# Patient Record
Sex: Female | Born: 1980 | Race: White | Hispanic: No | Marital: Married | Smoking: Former smoker
Health system: Southern US, Community
[De-identification: ages and names within clinical notes are randomized; demographics above are authoritative.]

## PROBLEM LIST (undated history)

## (undated) DIAGNOSIS — Z8759 Personal history of other complications of pregnancy, childbirth and the puerperium: Secondary | ICD-10-CM

## (undated) DIAGNOSIS — H209 Unspecified iridocyclitis: Secondary | ICD-10-CM

## (undated) DIAGNOSIS — R195 Other fecal abnormalities: Secondary | ICD-10-CM

## (undated) DIAGNOSIS — K5090 Crohn's disease, unspecified, without complications: Secondary | ICD-10-CM

## (undated) DIAGNOSIS — Z8709 Personal history of other diseases of the respiratory system: Secondary | ICD-10-CM

## (undated) DIAGNOSIS — C4491 Basal cell carcinoma of skin, unspecified: Secondary | ICD-10-CM

## (undated) HISTORY — DX: Personal history of other complications of pregnancy, childbirth and the puerperium: Z87.59

## (undated) HISTORY — DX: Basal cell carcinoma of skin, unspecified: C44.91

## (undated) HISTORY — DX: Crohn's disease, unspecified, without complications: K50.90

## (undated) HISTORY — DX: Other fecal abnormalities: R19.5

## (undated) HISTORY — DX: Unspecified iridocyclitis: H20.9

## (undated) HISTORY — DX: Personal history of other diseases of the respiratory system: Z87.09

---

## 2003-01-21 HISTORY — PX: TONSILLECTOMY: SUR1361

## 2015-09-20 LAB — HM HIV SCREENING LAB: HM HIV Screening: NEGATIVE

## 2017-11-19 DIAGNOSIS — M79642 Pain in left hand: Secondary | ICD-10-CM | POA: Insufficient documentation

## 2017-11-19 DIAGNOSIS — M79641 Pain in right hand: Secondary | ICD-10-CM | POA: Insufficient documentation

## 2017-11-19 DIAGNOSIS — Z8739 Personal history of other diseases of the musculoskeletal system and connective tissue: Secondary | ICD-10-CM | POA: Insufficient documentation

## 2017-11-19 DIAGNOSIS — Z13820 Encounter for screening for osteoporosis: Secondary | ICD-10-CM | POA: Insufficient documentation

## 2017-12-09 ENCOUNTER — Ambulatory Visit: Payer: Self-pay | Admitting: Family Medicine

## 2017-12-23 ENCOUNTER — Encounter: Payer: Self-pay | Admitting: Family Medicine

## 2017-12-23 ENCOUNTER — Ambulatory Visit (INDEPENDENT_AMBULATORY_CARE_PROVIDER_SITE_OTHER): Payer: BLUE CROSS/BLUE SHIELD | Admitting: Family Medicine

## 2017-12-23 VITALS — BP 108/72 | HR 60 | Ht 69.0 in | Wt 181.0 lb

## 2017-12-23 DIAGNOSIS — Z01419 Encounter for gynecological examination (general) (routine) without abnormal findings: Secondary | ICD-10-CM | POA: Diagnosis not present

## 2017-12-23 DIAGNOSIS — Z3202 Encounter for pregnancy test, result negative: Secondary | ICD-10-CM

## 2017-12-23 DIAGNOSIS — Z1151 Encounter for screening for human papillomavirus (HPV): Secondary | ICD-10-CM

## 2017-12-23 DIAGNOSIS — Z23 Encounter for immunization: Secondary | ICD-10-CM | POA: Diagnosis not present

## 2017-12-23 DIAGNOSIS — N912 Amenorrhea, unspecified: Secondary | ICD-10-CM

## 2017-12-23 DIAGNOSIS — R300 Dysuria: Secondary | ICD-10-CM

## 2017-12-23 DIAGNOSIS — Z30011 Encounter for initial prescription of contraceptive pills: Secondary | ICD-10-CM

## 2017-12-23 DIAGNOSIS — Z124 Encounter for screening for malignant neoplasm of cervix: Secondary | ICD-10-CM

## 2017-12-23 LAB — POCT URINALYSIS DIPSTICK
Blood, UA: NEGATIVE
Leukocytes, UA: NEGATIVE

## 2017-12-23 LAB — POCT URINE PREGNANCY: Preg Test, Ur: NEGATIVE

## 2017-12-23 MED ORDER — NORGESTIM-ETH ESTRAD TRIPHASIC 0.18/0.215/0.25 MG-25 MCG PO TABS: 1.0000 | ORAL_TABLET | Freq: Every day | ORAL | 11 refills | Status: DC

## 2017-12-23 NOTE — Progress Notes (Signed)
GYNECOLOGY ANNUAL PREVENTATIVE CARE ENCOUNTER NOTE  Subjective:   Laura Fuentes is a 37 y.o. G53P0102 female here for a routine annual gynecologic exam.  Current complaints: none, currently 7.5 mon postpartum from twin delivery complicated by severe preeclampsia with induction at 35wks, fetal intolerance and primary CS.   Denies abnormal vaginal bleeding, discharge, pelvic pain, problems with intercourse or other gynecologic concerns.    Gynecologic History No LMP recorded. Contraception: none Last Pap: 2017- wnl per report Last mammogram: na  The following portions of the patient's history were reviewed and updated as appropriate: allergies, current medications, past family history, past medical history, past social history, past surgical history and problem list.  Review of Systems Pertinent items are noted in HPI.   Objective:  BP 108/72   Pulse 60   Ht 5' 9"  (1.753 m)   Wt 181 lb (82.1 kg)   BMI 26.73 kg/m  CONSTITUTIONAL: Well-developed, well-nourished female in no acute distress.  HENT:  Normocephalic, atraumatic, External right and left ear normal. Oropharynx is clear and moist EYES:  No scleral icterus.  NECK: Normal range of motion, supple, no masses.  Normal thyroid.  SKIN: Skin is warm and dry. No rash noted. Not diaphoretic. No erythema. No pallor. NEUROLOGIC: Alert and oriented to person, place, and time. Normal reflexes, muscle tone coordination. No cranial nerve deficit noted. PSYCHIATRIC: Normal mood and affect. Normal behavior. Normal judgment and thought content. CARDIOVASCULAR: Normal heart rate noted, regular rhythm. 2+ distal pulses. RESPIRATORY: Effort and breath sounds normal, no problems with respiration noted. BREASTS: Symmetric in size. No masses, skin changes, nipple drainage, or lymphadenopathy. ABDOMEN: Soft,  no distention noted.  No tenderness, rebound or guarding.  PELVIC: Normal appearing external genitalia; normal appearing vaginal mucosa and  cervix.  No abnormal discharge noted.  Pap smear obtained.  Normal uterine size, no other palpable masses, no uterine or adnexal tenderness. MUSCULOSKELETAL: Normal range of motion.   Assessment and Plan:  1. Dysuria Reports incomplete emptying.  - POCT Urinalysis Dipstick - Consider pelvic floor PT  2. Encounter for oral contraception initial prescription Contraception counseling: Reviewed all forms of birth control options available including abstinence; over the counter/barrier methods; hormonal contraceptive medication including pill, patch, ring, injection,contraceptive implant; hormonal and nonhormonal IUDs; permanent sterilization options including vasectomy and the various tubal sterilization modalities. Risks and benefits reviewed.  Questions were answered.  Written information was also given to the patient to review.  Patient desires OCP, this was prescribed for patient. We discussed possible effects on milk supply. She will follow up in  61yrfor surveillance.  She was told to call with any further questions, or with any concerns about this method of contraception.  Emphasized use of condoms 100% of the time for STI prevention. - POCT urine pregnancy - Norgestimate-Ethinyl Estradiol Triphasic 0.18/0.215/0.25 MG-25 MCG tab; Take 1 tablet by mouth daily.  Dispense: 1 Package; Refill: 11  3. Well woman exam with routine gynecological exam Annual gynecologic examination with pap smear:  Will follow up results of pap smear and manage accordingly. Declined STI screen.  Routine preventative health maintenance measures emphasized. - POCT urine pregnancy - Cytology - PAP( [place]) - If BV present will treat per standing given + odor, +dicharge and +irritation.  - Norgestimate-Ethinyl Estradiol Triphasic 0.18/0.215/0.25 MG-25 MCG tab; Take 1 tablet by mouth daily.  Dispense: 1 Package; Refill: 11  4. Lactational amenorrhea- currently nursing infants 3-4 times daily and more over the  nighttime.   Please refer to  After Visit Summary for other counseling recommendations.   Return in about 1 year (around 12/24/2018) for Yearly wellness exam.  Caren Macadam, MD, MPH, ABFM Attending [place] for [facility]

## 2017-12-23 NOTE — Progress Notes (Signed)
Last Pap June 2017-normal   Think she may have BV or a yeast infection, having some pain with intercourse, and occasional urgency with urination.

## 2017-12-24 LAB — CYTOLOGY - PAP
Adequacy: ABSENT
Bacterial vaginitis: NEGATIVE
Candida vaginitis: NEGATIVE
Diagnosis: NEGATIVE
HPV: NOT DETECTED

## 2017-12-30 ENCOUNTER — Encounter: Payer: Self-pay | Admitting: Radiology

## 2018-01-01 DIAGNOSIS — L718 Other rosacea: Secondary | ICD-10-CM | POA: Diagnosis not present

## 2018-01-01 DIAGNOSIS — L301 Dyshidrosis [pompholyx]: Secondary | ICD-10-CM | POA: Diagnosis not present

## 2018-01-01 DIAGNOSIS — D485 Neoplasm of uncertain behavior of skin: Secondary | ICD-10-CM | POA: Diagnosis not present

## 2018-01-01 DIAGNOSIS — D2261 Melanocytic nevi of right upper limb, including shoulder: Secondary | ICD-10-CM | POA: Diagnosis not present

## 2018-03-02 ENCOUNTER — Encounter: Payer: Self-pay | Admitting: Family Medicine

## 2018-03-02 ENCOUNTER — Ambulatory Visit (INDEPENDENT_AMBULATORY_CARE_PROVIDER_SITE_OTHER): Payer: BLUE CROSS/BLUE SHIELD | Admitting: Family Medicine

## 2018-03-02 VITALS — BP 98/68 | HR 60 | Temp 98.4°F | Ht 69.0 in | Wt 181.0 lb

## 2018-03-02 DIAGNOSIS — F419 Anxiety disorder, unspecified: Secondary | ICD-10-CM | POA: Diagnosis not present

## 2018-03-02 DIAGNOSIS — F430 Acute stress reaction: Secondary | ICD-10-CM | POA: Diagnosis not present

## 2018-03-02 MED ORDER — HYDROXYZINE PAMOATE 50 MG PO CAPS: 50.0000 mg | ORAL_CAPSULE | Freq: Three times a day (TID) | ORAL | 1 refills | Status: DC | PRN

## 2018-03-02 NOTE — Assessment & Plan Note (Signed)
Given symptoms x 3 weeks this diagnosis is most consistent. Cont therapy. Discussed that with stopping breast feeding and starting OCPs it may just take time for her body to adjust. However, could consider SSRI if not improvement. Elected to wait and will trial hydroxyzine prn and [facility] Wart.

## 2018-03-02 NOTE — Patient Instructions (Signed)
#  Acute stress reaction - consider trying regular [facility] Wart or Lavendar Oil  -- Use Hydroxyzine as needed if feeling stressed or highly irritable. It may make you sleepy  Return in a few weeks if no improvement and you want to start medication

## 2018-03-02 NOTE — Progress Notes (Signed)
Subjective:     Laura Fuentes is a 38 y.o. female presenting for Establish Care (previous PCP in [place]) and Depression (Weaned off her twins off breastfeeding about 3 weeks ago and also started birth control right around that time and is struggling with depression symptoms now.)     HPI  #Depression - started birth control and weaned breast feeding 3 weeks ago - symptoms since - noticed some emotional eating or snacking, but this seems to have gone away - has had periods of life where she has felt off, but typically managed with diet/exercise changes - has never been on an antidepressant, but considering it - more irritable than before - went through a week of feeling this way when they moved to [place] 6 months ago, but that has improved - has been in therapy for about 1 month - was seeing a therapist   #urinary urgency - has been going on since she had the baby - asked the ob/gyn and told it was normal - vaginal dryness - normal work-up with OB - felt this was normal and would improve with OCPs  Review of Systems See HPI  Social History   Tobacco Use  Smoking Status Former Smoker  . Packs/day: 0.25  . Years: 5.00  . Pack years: 1.25  . Types: Cigarettes  . Last attempt to quit: 01/20/2013  . Years since quitting: 5.1  Smokeless Tobacco Never Used  Tobacco Comment   social smoker- 4 to 5 cigarretes at a time        Objective:    BP Readings from Last 3 Encounters:  03/02/18 98/68  12/23/17 108/72   Wt Readings from Last 3 Encounters:  03/02/18 181 lb (82.1 kg)  12/23/17 181 lb (82.1 kg)    BP 98/68   Pulse 60   Temp 98.4 F (36.9 C)   Ht 5' 9"  (1.753 m)   Wt 181 lb (82.1 kg)   LMP 03/02/2018   SpO2 98%   BMI 26.73 kg/m    Physical Exam Constitutional:      General: She is not in acute distress.    Appearance: She is well-developed. She is not diaphoretic.  HENT:     Right Ear: External ear normal.     Left Ear: External ear normal.     Nose:  Nose normal.  Eyes:     Conjunctiva/sclera: Conjunctivae normal.  Neck:     Musculoskeletal: Neck supple.  Cardiovascular:     Rate and Rhythm: Normal rate.  Pulmonary:     Effort: Pulmonary effort is normal.  Skin:    General: Skin is warm and dry.     Capillary Refill: Capillary refill takes less than 2 seconds.  Neurological:     Mental Status: She is alert. Mental status is at baseline.  Psychiatric:        Mood and Affect: Mood normal.        Behavior: Behavior normal.    Depression screen [facility] 2/9 03/02/2018  Decreased Interest 0  Down, Depressed, Hopeless 2  PHQ - 2 Score 2  Altered sleeping 0  Tired, decreased energy 1  Change in appetite 1  Feeling bad or failure about yourself  2  Trouble concentrating 2  Moving slowly or fidgety/restless 0  Suicidal thoughts 1  PHQ-9 Score 9           Assessment & Plan:   Problem List Items Addressed This Visit      Other   Acute  stress reaction    Given symptoms x 3 weeks this diagnosis is most consistent. Cont therapy. Discussed that with stopping breast feeding and starting OCPs it may just take time for her body to adjust. However, could consider SSRI if not improvement. Elected to wait and will trial hydroxyzine prn and [facility] Wart.       Relevant Medications   hydrOXYzine (VISTARIL) 50 MG capsule    Other Visit Diagnoses    Anxiety    -  Primary   Relevant Medications   hydrOXYzine (VISTARIL) 50 MG capsule       Return in about 4 weeks (around 03/30/2018).  Lesleigh Noe, MD

## 2018-03-18 ENCOUNTER — Ambulatory Visit: Payer: BLUE CROSS/BLUE SHIELD | Admitting: Family Medicine

## 2018-03-29 ENCOUNTER — Encounter: Payer: Self-pay | Admitting: Family Medicine

## 2018-03-29 DIAGNOSIS — J302 Other seasonal allergic rhinitis: Secondary | ICD-10-CM | POA: Insufficient documentation

## 2018-03-31 DIAGNOSIS — F4323 Adjustment disorder with mixed anxiety and depressed mood: Secondary | ICD-10-CM | POA: Diagnosis not present

## 2018-04-14 DIAGNOSIS — F4323 Adjustment disorder with mixed anxiety and depressed mood: Secondary | ICD-10-CM | POA: Diagnosis not present

## 2018-04-23 DIAGNOSIS — F4323 Adjustment disorder with mixed anxiety and depressed mood: Secondary | ICD-10-CM | POA: Diagnosis not present

## 2018-04-28 DIAGNOSIS — F4323 Adjustment disorder with mixed anxiety and depressed mood: Secondary | ICD-10-CM | POA: Diagnosis not present

## 2018-05-05 DIAGNOSIS — F4323 Adjustment disorder with mixed anxiety and depressed mood: Secondary | ICD-10-CM | POA: Diagnosis not present

## 2018-05-10 ENCOUNTER — Other Ambulatory Visit: Payer: Self-pay | Admitting: *Deleted

## 2018-05-10 MED ORDER — PHENAZOPYRIDINE HCL 200 MG PO TABS: 200.0000 mg | ORAL_TABLET | Freq: Three times a day (TID) | ORAL | 1 refills | Status: DC | PRN

## 2018-05-10 MED ORDER — NITROFURANTOIN MONOHYD MACRO 100 MG PO CAPS: 100.0000 mg | ORAL_CAPSULE | Freq: Two times a day (BID) | ORAL | 1 refills | Status: DC

## 2018-05-10 NOTE — Telephone Encounter (Signed)
Pt called requesting meds for UTI, sent in med to [facility] per protocol, left message for patient that meds where sent in and if UTI is not better then to call office back to come in.

## 2018-05-12 DIAGNOSIS — F4323 Adjustment disorder with mixed anxiety and depressed mood: Secondary | ICD-10-CM | POA: Diagnosis not present

## 2018-05-19 DIAGNOSIS — F4323 Adjustment disorder with mixed anxiety and depressed mood: Secondary | ICD-10-CM | POA: Diagnosis not present

## 2018-06-02 DIAGNOSIS — F4323 Adjustment disorder with mixed anxiety and depressed mood: Secondary | ICD-10-CM | POA: Diagnosis not present

## 2018-06-09 DIAGNOSIS — F4323 Adjustment disorder with mixed anxiety and depressed mood: Secondary | ICD-10-CM | POA: Diagnosis not present

## 2018-06-16 DIAGNOSIS — F4323 Adjustment disorder with mixed anxiety and depressed mood: Secondary | ICD-10-CM | POA: Diagnosis not present

## 2018-06-23 DIAGNOSIS — F4323 Adjustment disorder with mixed anxiety and depressed mood: Secondary | ICD-10-CM | POA: Diagnosis not present

## 2018-06-30 DIAGNOSIS — F4323 Adjustment disorder with mixed anxiety and depressed mood: Secondary | ICD-10-CM | POA: Diagnosis not present

## 2018-07-12 DIAGNOSIS — F4323 Adjustment disorder with mixed anxiety and depressed mood: Secondary | ICD-10-CM | POA: Diagnosis not present

## 2018-07-14 DIAGNOSIS — F4323 Adjustment disorder with mixed anxiety and depressed mood: Secondary | ICD-10-CM | POA: Diagnosis not present

## 2018-07-26 DIAGNOSIS — F4323 Adjustment disorder with mixed anxiety and depressed mood: Secondary | ICD-10-CM | POA: Diagnosis not present

## 2018-08-04 DIAGNOSIS — F4323 Adjustment disorder with mixed anxiety and depressed mood: Secondary | ICD-10-CM | POA: Diagnosis not present

## 2018-08-11 DIAGNOSIS — F4323 Adjustment disorder with mixed anxiety and depressed mood: Secondary | ICD-10-CM | POA: Diagnosis not present

## 2018-08-16 ENCOUNTER — Other Ambulatory Visit: Payer: Self-pay

## 2018-08-16 ENCOUNTER — Ambulatory Visit (INDEPENDENT_AMBULATORY_CARE_PROVIDER_SITE_OTHER): Payer: BC Managed Care – PPO | Admitting: Family Medicine

## 2018-08-16 ENCOUNTER — Encounter: Payer: Self-pay | Admitting: Family Medicine

## 2018-08-16 VITALS — BP 114/78 | HR 82 | Temp 98.1°F | Resp 18 | Ht 69.0 in | Wt 185.0 lb

## 2018-08-16 DIAGNOSIS — H65191 Other acute nonsuppurative otitis media, right ear: Secondary | ICD-10-CM

## 2018-08-16 DIAGNOSIS — H60391 Other infective otitis externa, right ear: Secondary | ICD-10-CM | POA: Diagnosis not present

## 2018-08-16 MED ORDER — NEOMYCIN-POLYMYXIN-HC 3.5-10000-1 OT SOLN: 4.0000 [drp] | Freq: Four times a day (QID) | OTIC | 0 refills | Status: DC

## 2018-08-16 MED ORDER — AMOXICILLIN-POT CLAVULANATE 875-125 MG PO TABS: 1.0000 | ORAL_TABLET | Freq: Two times a day (BID) | ORAL | 0 refills | Status: DC

## 2018-08-16 NOTE — Progress Notes (Signed)
Subjective:     Laura Fuentes is a 38 y.o. female presenting for Ear Pain (right ear. started on 08/14/2018. Pain in jaw on right side.)     Otalgia  There is pain in the right ear. This is a new problem. The current episode started in the past 7 days. The problem occurs constantly. The problem has been unchanged. There has been no fever. The pain is moderate. Pertinent negatives include no coughing, diarrhea, ear discharge, headaches, hearing loss, rhinorrhea, sore throat or vomiting. Associated symptoms comments: Jaw pain. She has tried NSAIDs and ear drops for the symptoms. The treatment provided mild relief.   Felt like there was a "scab" which has been there for a few weeks Has been swimming a lot lately  Used some OTC swimmer's ears drop w/o improvement Ibuprofen helped with pain   Review of Systems  HENT: Positive for ear pain. Negative for congestion, ear discharge, hearing loss, rhinorrhea, sore throat and tinnitus.   Respiratory: Negative for cough.   Gastrointestinal: Negative for diarrhea and vomiting.  Neurological: Negative for headaches.     Social History   Tobacco Use  Smoking Status Former Smoker  . Packs/day: 0.25  . Years: 5.00  . Pack years: 1.25  . Types: Cigarettes  . Quit date: 01/20/2013  . Years since quitting: 5.5  Smokeless Tobacco Never Used  Tobacco Comment   social smoker- 4 to 5 cigarretes at a time        Objective:    BP Readings from Last 3 Encounters:  08/16/18 114/78  03/02/18 98/68  12/23/17 108/72   Wt Readings from Last 3 Encounters:  08/16/18 185 lb (83.9 kg)  03/02/18 181 lb (82.1 kg)  12/23/17 181 lb (82.1 kg)    BP 114/78   Pulse 82   Temp 98.1 F (36.7 C)   Resp 18   Ht 5' 9"  (6.568 m)   Wt 185 lb (83.9 kg)   LMP 08/08/2018   BMI 27.32 kg/m    Physical Exam Constitutional:      General: She is not in acute distress.    Appearance: She is well-developed. She is not diaphoretic.  HENT:     Right Ear:  External ear normal. Swelling (ear canal) present.     Left Ear: Tympanic membrane, ear canal and external ear normal.     Ears:     Comments: Some difficulty visualizing the TM due to ear canal swelling and erythema. Some effusion present and a red spot on the ear drum. External ear with a dry patch of skin with peeling and mild erythema.     Nose: Nose normal.  Eyes:     Conjunctiva/sclera: Conjunctivae normal.  Neck:     Musculoskeletal: Neck supple.  Cardiovascular:     Rate and Rhythm: Normal rate and regular rhythm.     Heart sounds: No murmur.  Pulmonary:     Effort: Pulmonary effort is normal. No respiratory distress.     Breath sounds: Normal breath sounds. No wheezing.  Skin:    General: Skin is warm and dry.     Capillary Refill: Capillary refill takes less than 2 seconds.  Neurological:     Mental Status: She is alert. Mental status is at baseline.  Psychiatric:        Mood and Affect: Mood normal.        Behavior: Behavior normal.           Assessment & Plan:   Problem  List Items Addressed This Visit    None    Visit Diagnoses    Other infective acute otitis externa of right ear    -  Primary   Relevant Medications   amoxicillin-clavulanate (AUGMENTIN) 875-125 MG tablet   Other non-recurrent acute nonsuppurative otitis media of right ear       Relevant Medications   amoxicillin-clavulanate (AUGMENTIN) 875-125 MG tablet   neomycin-polymyxin-hydrocortisone (CORTISPORIN) OTIC solution     Given swelling suspect otitis externa. As difficulty visualizing the TM and some signs of otitis media discussed starting drops and if limited improvement starting oral Abx as well.   Return if symptoms worsen or fail to improve.  Lesleigh Noe, MD

## 2018-08-16 NOTE — Patient Instructions (Signed)
Use the ear drops - if not improving after 2-3 days then would recommend continuing but starting the oral antibiotics Can use ear drops up to 10 days or until the pain and swelling as resolved    Otitis Externa  Otitis externa is an infection of the outer ear canal. The outer ear canal is the area between the outside of the ear and the eardrum. Otitis externa is sometimes called swimmer's ear. What are the causes? Common causes of this condition include:  Swimming in dirty water.  Moisture in the ear.  An injury to the inside of the ear.  An object stuck in the ear.  A cut or scrape on the outside of the ear. What increases the risk? You are more likely to get this condition if you go swimming often. What are the signs or symptoms?  Itching in the ear. This is often the first symptom.  Swelling of the ear.  Redness in the ear.  Ear pain. The pain may get worse when you pull on your ear.  Pus coming from the ear. How is this treated? This condition may be treated with:  Antibiotic ear drops. These are often given for 10-14 days.  Medicines to reduce itching and swelling. Follow these instructions at home:  If you were given antibiotic ear drops, use them as told by your doctor. Do not stop using them even if your condition gets better.  Take over-the-counter and prescription medicines only as told by your doctor.  Avoid getting water in your ears as told by your doctor. You may be told to avoid swimming or water sports for a few days.  Keep all follow-up visits as told by your doctor. This is important. How is this prevented?  Keep your ears dry. Use the corner of a towel to dry your ears after you swim or bathe.  Try not to scratch or put things in your ear. Doing these things makes it easier for germs to grow in your ear.  Avoid swimming in lakes, dirty water, or pools that may not have the right amount of a chemical called chlorine. Contact a doctor if:  You  have a fever.  Your ear is still red, swollen, or painful after 3 days.  You still have pus coming from your ear after 3 days.  Your redness, swelling, or pain gets worse.  You have a really bad headache.  You have redness, swelling, pain, or tenderness behind your ear. Summary  Otitis externa is an infection of the outer ear canal.  Symptoms include pain, redness, and swelling of the ear.  If you were given antibiotic ear drops, use them as told by your doctor. Do not stop using them even if your condition gets better.  Try not to scratch or put things in your ear. This information is not intended to replace advice given to you by your health care provider. Make sure you discuss any questions you have with your health care provider. Document Released: 06/25/2007 Document Revised: 06/12/2017 Document Reviewed: 06/12/2017 Elsevier Patient Education  2020 [employer].

## 2018-08-19 DIAGNOSIS — F4323 Adjustment disorder with mixed anxiety and depressed mood: Secondary | ICD-10-CM | POA: Diagnosis not present

## 2018-09-01 DIAGNOSIS — F4323 Adjustment disorder with mixed anxiety and depressed mood: Secondary | ICD-10-CM | POA: Diagnosis not present

## 2018-09-22 DIAGNOSIS — F4323 Adjustment disorder with mixed anxiety and depressed mood: Secondary | ICD-10-CM | POA: Diagnosis not present

## 2018-09-29 DIAGNOSIS — F4323 Adjustment disorder with mixed anxiety and depressed mood: Secondary | ICD-10-CM | POA: Diagnosis not present

## 2018-10-06 DIAGNOSIS — F4323 Adjustment disorder with mixed anxiety and depressed mood: Secondary | ICD-10-CM | POA: Diagnosis not present

## 2018-10-13 DIAGNOSIS — F4323 Adjustment disorder with mixed anxiety and depressed mood: Secondary | ICD-10-CM | POA: Diagnosis not present

## 2018-10-27 DIAGNOSIS — F4323 Adjustment disorder with mixed anxiety and depressed mood: Secondary | ICD-10-CM | POA: Diagnosis not present

## 2018-11-03 DIAGNOSIS — F4323 Adjustment disorder with mixed anxiety and depressed mood: Secondary | ICD-10-CM | POA: Diagnosis not present

## 2018-11-17 DIAGNOSIS — F4323 Adjustment disorder with mixed anxiety and depressed mood: Secondary | ICD-10-CM | POA: Diagnosis not present

## 2018-11-22 ENCOUNTER — Other Ambulatory Visit: Payer: Self-pay

## 2018-11-22 ENCOUNTER — Encounter: Payer: Self-pay | Admitting: Family Medicine

## 2018-11-22 ENCOUNTER — Ambulatory Visit (INDEPENDENT_AMBULATORY_CARE_PROVIDER_SITE_OTHER): Payer: BC Managed Care – PPO | Admitting: Family Medicine

## 2018-11-22 VITALS — BP 114/86 | HR 68 | Temp 98.0°F | Ht 69.0 in | Wt 194.5 lb

## 2018-11-22 DIAGNOSIS — R635 Abnormal weight gain: Secondary | ICD-10-CM | POA: Diagnosis not present

## 2018-11-22 DIAGNOSIS — R590 Localized enlarged lymph nodes: Secondary | ICD-10-CM

## 2018-11-22 LAB — TSH: TSH: 3.56 u[IU]/mL (ref 0.35–4.50)

## 2018-11-22 LAB — COMPREHENSIVE METABOLIC PANEL
ALT: 14 U/L (ref 0–35)
AST: 15 U/L (ref 0–37)
Albumin: 4.3 g/dL (ref 3.5–5.2)
Alkaline Phosphatase: 64 U/L (ref 39–117)
BUN: 14 mg/dL (ref 6–23)
CO2: 27 mEq/L (ref 19–32)
Calcium: 9 mg/dL (ref 8.4–10.5)
Chloride: 106 mEq/L (ref 96–112)
Creatinine, Ser: 0.77 mg/dL (ref 0.40–1.20)
GFR: 83.9 mL/min (ref >60.00)
Glucose, Bld: 91 mg/dL (ref 70–99)
Potassium: 4.1 mEq/L (ref 3.5–5.1)
Sodium: 139 mEq/L (ref 135–145)
Total Bilirubin: 0.7 mg/dL (ref 0.2–1.2)
Total Protein: 6.8 g/dL (ref 6.0–8.3)

## 2018-11-22 LAB — CBC
HCT: 41.7 % (ref 36.0–46.0)
Hemoglobin: 14 g/dL (ref 12.0–15.0)
MCHC: 33.5 g/dL (ref 30.0–36.0)
MCV: 92.5 fl (ref 78.0–100.0)
Platelets: 348 10*3/uL (ref 150.0–400.0)
RBC: 4.51 Mil/uL (ref 3.87–5.11)
RDW: 12.6 % (ref 11.5–15.5)
WBC: 4.5 10*3/uL (ref 4.0–10.5)

## 2018-11-22 NOTE — Assessment & Plan Note (Signed)
Shotty lymph nodes in setting of tattoo removal process. Reassuring that they seem to be resolving on their own. Will get CBC to evaluate WBC. Continue to monitor and notify is worsening.

## 2018-11-22 NOTE — Assessment & Plan Note (Signed)
Postpartum weight gain following stopping breast feeding. Will check TSH and screen for diabetes. Discussed calorie counting pt already cutting back on alcohol and snacking. Return is worsening

## 2018-11-22 NOTE — Patient Instructions (Addendum)
#  weight gain - try to do some calorie counting - continue regular exercise   #Enlarged lymph nodes - get some labs today - reassuring signs: small and painful, and going away - Call back: if not going away or if continuing to recur and being more bothersome let me know

## 2018-11-22 NOTE — Progress Notes (Signed)
Subjective:     Laura Fuentes is a 38 y.o. female presenting for Lumps under armpit (started on 11/03/2018 in the right armpit and then went to left side. )     HPI   #Lumps under armpit - started on 11/03/2018 - currently getting a tattoo removed - which happens via the lymph symptoms - tattoo on the right side but getting lesions in b/l axilla - a few weeks ago had painful bumps in right which lasted a few days and resolved - schedule Wednesday and the left resolved after ~10 days - does shave her arms - deep lumps  - no skin changes - no redness - no drainage - Treatment: did warm compress - largest spot was the size of a bebby  -   Has noticed weight gain since stopping breastfeeding - runs 2 miles 4 days a week - does do some late night snacking - baseline weight ~155 lb - working with a natural path to get some treatment - did get down to 175 while breast feeding - gained to 210 with pregnancy - was ~165 lbs when she got pregnant   Review of Systems  Constitutional: Positive for unexpected weight change. Negative for chills, fatigue and fever.  Endocrine: Positive for heat intolerance. Negative for cold intolerance, polydipsia, polyphagia and polyuria.     Social History   Tobacco Use  Smoking Status Former Smoker  . Packs/day: 0.25  . Years: 5.00  . Pack years: 1.25  . Types: Cigarettes  . Quit date: 01/20/2013  . Years since quitting: 5.8  Smokeless Tobacco Never Used  Tobacco Comment   social smoker- 4 to 5 cigarretes at a time        Objective:    BP Readings from Last 3 Encounters:  11/22/18 114/86  08/16/18 114/78  03/02/18 98/68   Wt Readings from Last 3 Encounters:  11/22/18 194 lb 8 oz (88.2 kg)  08/16/18 185 lb (83.9 kg)  03/02/18 181 lb (82.1 kg)    BP 114/86   Pulse 68   Temp 98 F (36.7 C)   Ht 5' 9"  (1.753 m)   Wt 194 lb 8 oz (88.2 kg)   LMP 11/18/2018   SpO2 99%   BMI 28.72 kg/m    Physical Exam Constitutional:       General: She is not in acute distress.    Appearance: She is well-developed. She is not diaphoretic.  HENT:     Right Ear: External ear normal.     Left Ear: External ear normal.     Nose: Nose normal.  Eyes:     Conjunctiva/sclera: Conjunctivae normal.  Neck:     Musculoskeletal: Neck supple.  Cardiovascular:     Rate and Rhythm: Normal rate.  Pulmonary:     Effort: Pulmonary effort is normal.  Lymphadenopathy:     Upper Body:     Right upper body: Axillary adenopathy present.     Left upper body: No axillary adenopathy.  Skin:    General: Skin is warm and dry.     Capillary Refill: Capillary refill takes less than 2 seconds.     Comments: Right axilla with some comedones. No erythema. Normal hair follicles.   Neurological:     Mental Status: She is alert. Mental status is at baseline.  Psychiatric:        Mood and Affect: Mood normal.        Behavior: Behavior normal.  Assessment & Plan:   Problem List Items Addressed This Visit      Immune and Lymphatic   Lymphadenopathy, axillary    Shotty lymph nodes in setting of tattoo removal process. Reassuring that they seem to be resolving on their own. Will get CBC to evaluate WBC. Continue to monitor and notify is worsening.       Relevant Orders   CBC     Other   Weight gain - Primary    Postpartum weight gain following stopping breast feeding. Will check TSH and screen for diabetes. Discussed calorie counting pt already cutting back on alcohol and snacking. Return is worsening      Relevant Orders   TSH   Comprehensive metabolic panel       Return if symptoms worsen or fail to improve.  Lesleigh Noe, MD

## 2018-11-24 DIAGNOSIS — F4323 Adjustment disorder with mixed anxiety and depressed mood: Secondary | ICD-10-CM | POA: Diagnosis not present

## 2018-12-08 DIAGNOSIS — F4323 Adjustment disorder with mixed anxiety and depressed mood: Secondary | ICD-10-CM | POA: Diagnosis not present

## 2018-12-14 DIAGNOSIS — F4323 Adjustment disorder with mixed anxiety and depressed mood: Secondary | ICD-10-CM | POA: Diagnosis not present

## 2018-12-22 DIAGNOSIS — F4323 Adjustment disorder with mixed anxiety and depressed mood: Secondary | ICD-10-CM | POA: Diagnosis not present

## 2019-01-05 ENCOUNTER — Ambulatory Visit: Payer: BC Managed Care – PPO | Admitting: Family Medicine

## 2019-01-05 DIAGNOSIS — F4323 Adjustment disorder with mixed anxiety and depressed mood: Secondary | ICD-10-CM | POA: Diagnosis not present

## 2019-01-19 ENCOUNTER — Other Ambulatory Visit: Payer: Self-pay

## 2019-01-19 ENCOUNTER — Encounter: Payer: Self-pay | Admitting: Family Medicine

## 2019-01-19 ENCOUNTER — Ambulatory Visit (INDEPENDENT_AMBULATORY_CARE_PROVIDER_SITE_OTHER): Payer: BC Managed Care – PPO | Admitting: Family Medicine

## 2019-01-19 VITALS — BP 112/73 | HR 63 | Ht 69.0 in | Wt 189.0 lb

## 2019-01-19 DIAGNOSIS — Z0000 Encounter for general adult medical examination without abnormal findings: Secondary | ICD-10-CM

## 2019-01-19 NOTE — Progress Notes (Signed)
   GYNECOLOGY ANNUAL PREVENTATIVE CARE ENCOUNTER NOTE  Subjective:   Aricela Bertagnolli is a 38 y.o. G63P0102 female here for a routine annual gynecologic exam.  Current complaints: none.   Denies abnormal vaginal bleeding, discharge, pelvic pain, problems with intercourse or other gynecologic concerns.    Gynecologic History Patient's last menstrual period was 01/05/2019 (exact date). Contraception: NFP Last Pap: 2019. Results were: normal Last mammogram: NA  The following portions of the patient's history were reviewed and updated as appropriate: allergies, current medications, past family history, past medical history, past social history, past surgical history and problem list.  Review of Systems Pertinent items are noted in HPI.   Objective:  BP 112/73   Pulse 63   Ht 5' 9"  (1.753 m)   Wt 189 lb (85.7 kg)   LMP 01/05/2019 (Exact Date)   BMI 27.91 kg/m  CONSTITUTIONAL: Well-developed, well-nourished female in no acute distress.  HENT:  Normocephalic, atraumatic, External right and left ear normal. Oropharynx is clear and moist EYES:  No scleral icterus.  NECK: Normal range of motion, supple, no masses.  Normal thyroid.  SKIN: Skin is warm and dry. No rash noted. Not diaphoretic. No erythema. No pallor. NEUROLOGIC: Alert and oriented to person, place, and time. Normal reflexes, muscle tone coordination. No cranial nerve deficit noted. PSYCHIATRIC: Normal mood and affect. Normal behavior. Normal judgment and thought content. CARDIOVASCULAR: Normal heart rate noted, regular rhythm. 2+ distal pulses. RESPIRATORY: Effort and breath sounds normal, no problems with respiration noted. BREASTS: Symmetric in size. No masses, skin changes, nipple drainage, or lymphadenopathy. ABDOMEN: Soft,  no distention noted.  No tenderness, rebound or guarding.  PELVIC: Not indicated. MUSCULOSKELETAL: Normal range of motion.   Assessment and Plan:  1) Annual gynecologic examination: Pap up to date.  Routine preventative health maintenance measures emphasized.  2) Contraception counseling: Reviewed all forms of birth control options available including abstinence; over the counter/barrier methods; hormonal contraceptive medication including pill, patch, ring, injection,contraceptive implant; hormonal and nonhormonal IUDs; permanent sterilization options including vasectomy and the various tubal sterilization modalities. Risks and benefits reviewed.  Questions were answered.  Written information was also given to the patient to review.  Patient desires NFP. Considering pregnancy in the next year.   Please refer to After Visit Summary for other counseling recommendations.   Return in about 1 year (around 01/19/2020) for Yearly wellness exam.  Caren Macadam, MD, MPH, ABFM Attending [facility]

## 2019-01-26 DIAGNOSIS — F4323 Adjustment disorder with mixed anxiety and depressed mood: Secondary | ICD-10-CM | POA: Diagnosis not present

## 2019-02-16 DIAGNOSIS — F4323 Adjustment disorder with mixed anxiety and depressed mood: Secondary | ICD-10-CM | POA: Diagnosis not present

## 2019-03-02 DIAGNOSIS — F4323 Adjustment disorder with mixed anxiety and depressed mood: Secondary | ICD-10-CM | POA: Diagnosis not present

## 2019-03-14 ENCOUNTER — Telehealth: Payer: Self-pay | Admitting: Radiology

## 2019-03-14 ENCOUNTER — Other Ambulatory Visit: Payer: Self-pay | Admitting: Family Medicine

## 2019-03-14 DIAGNOSIS — Z30011 Encounter for initial prescription of contraceptive pills: Secondary | ICD-10-CM

## 2019-03-14 MED ORDER — NORGESTIM-ETH ESTRAD TRIPHASIC 0.18/0.215/0.25 MG-35 MCG PO TABS: 1.0000 | ORAL_TABLET | Freq: Every day | ORAL | 11 refills | Status: DC

## 2019-03-14 NOTE — Progress Notes (Signed)
Patient prefers orthotri-cyclen. Rx sent for 1 year of trisprintec which is equivalent medication.

## 2019-03-14 NOTE — Telephone Encounter (Signed)
Patient called requesting to speak with Dr Ernestina Patches about [facility], states that she was was on Ortho tri-cyclin before pregnancy, then once delivered placed on different BC that was given during breast feeding. Stated that once she stopped breast feeding she had a reaction so she stopped the birth control that was prescribed. Would like to speak with Dr Ernestina Patches about starting birth control. Message given to Dr Ernestina Patches.

## 2019-03-16 DIAGNOSIS — F4323 Adjustment disorder with mixed anxiety and depressed mood: Secondary | ICD-10-CM | POA: Diagnosis not present

## 2019-03-30 DIAGNOSIS — F4323 Adjustment disorder with mixed anxiety and depressed mood: Secondary | ICD-10-CM | POA: Diagnosis not present

## 2019-04-13 DIAGNOSIS — F4323 Adjustment disorder with mixed anxiety and depressed mood: Secondary | ICD-10-CM | POA: Diagnosis not present

## 2019-04-27 DIAGNOSIS — F4323 Adjustment disorder with mixed anxiety and depressed mood: Secondary | ICD-10-CM | POA: Diagnosis not present

## 2019-05-25 DIAGNOSIS — F4323 Adjustment disorder with mixed anxiety and depressed mood: Secondary | ICD-10-CM | POA: Diagnosis not present

## 2019-05-27 DIAGNOSIS — B029 Zoster without complications: Secondary | ICD-10-CM | POA: Diagnosis not present

## 2019-06-24 DIAGNOSIS — L710 Perioral dermatitis: Secondary | ICD-10-CM | POA: Diagnosis not present

## 2019-07-07 DIAGNOSIS — F4323 Adjustment disorder with mixed anxiety and depressed mood: Secondary | ICD-10-CM | POA: Diagnosis not present

## 2019-07-07 DIAGNOSIS — F3281 Premenstrual dysphoric disorder: Secondary | ICD-10-CM | POA: Diagnosis not present

## 2019-07-07 DIAGNOSIS — F410 Panic disorder [episodic paroxysmal anxiety] without agoraphobia: Secondary | ICD-10-CM | POA: Diagnosis not present

## 2019-07-07 DIAGNOSIS — F40248 Other situational type phobia: Secondary | ICD-10-CM | POA: Diagnosis not present

## 2019-08-03 DIAGNOSIS — F4323 Adjustment disorder with mixed anxiety and depressed mood: Secondary | ICD-10-CM | POA: Diagnosis not present

## 2019-08-04 DIAGNOSIS — F4323 Adjustment disorder with mixed anxiety and depressed mood: Secondary | ICD-10-CM | POA: Diagnosis not present

## 2019-08-04 DIAGNOSIS — F40248 Other situational type phobia: Secondary | ICD-10-CM | POA: Diagnosis not present

## 2019-08-04 DIAGNOSIS — F410 Panic disorder [episodic paroxysmal anxiety] without agoraphobia: Secondary | ICD-10-CM | POA: Diagnosis not present

## 2019-08-04 DIAGNOSIS — F3281 Premenstrual dysphoric disorder: Secondary | ICD-10-CM | POA: Diagnosis not present

## 2019-08-17 DIAGNOSIS — F4323 Adjustment disorder with mixed anxiety and depressed mood: Secondary | ICD-10-CM | POA: Diagnosis not present

## 2019-08-31 ENCOUNTER — Other Ambulatory Visit: Payer: Self-pay

## 2019-08-31 ENCOUNTER — Telehealth (INDEPENDENT_AMBULATORY_CARE_PROVIDER_SITE_OTHER): Payer: BC Managed Care – PPO | Admitting: Family Medicine

## 2019-08-31 ENCOUNTER — Encounter: Payer: Self-pay | Admitting: Family Medicine

## 2019-08-31 DIAGNOSIS — R21 Rash and other nonspecific skin eruption: Secondary | ICD-10-CM | POA: Diagnosis not present

## 2019-08-31 MED ORDER — CEPHALEXIN 500 MG PO CAPS: 500.0000 mg | ORAL_CAPSULE | Freq: Four times a day (QID) | ORAL | 0 refills | Status: DC

## 2019-08-31 MED ORDER — TRIAMCINOLONE ACETONIDE 0.1 % EX CREA: 1.0000 "application " | TOPICAL_CREAM | Freq: Two times a day (BID) | CUTANEOUS | 0 refills | Status: DC

## 2019-08-31 NOTE — Progress Notes (Signed)
Virtual Visit via Video Note  I connected with Laura Fuentes on 08/31/19 at 12:00 PM EDT by a video enabled telemedicine application and verified that I am speaking with the correct person using two identifiers.  Location: Patient: out of state, hotel room Provider: office   I discussed the limitations of evaluation and management by telemedicine and the availability of in person appointments. The patient expressed understanding and agreed to proceed.  Parties involved in encounter  [place]  Provider:  Loura Pardon MD    History of Present Illness: Pt presents with rash and ? Possible bug bite  Out of town  In [place] Some have popped  Has not seen an insect bite her  Arms and legs  Some itchy   First bump was before travel - L shoulder area   Has not been around poison ivy  [place]  Also went to a water park  (had these before hand)   2 on her buttocks   Shares bed with husband-he has no bites and doubts bedbugs    May have had shingles in April on arm  Close to her 2nd covid vaccine   Using caladryl on spots Some neosporin as well  She took a benadryl one night   Has had the chicken pox in the past   No lesions on trunk/abd or chest  None on palms/soles  No tick bites   No fever  Kids on trip are not sick   Patient Active Problem List   Diagnosis Date Noted  . Rash and nonspecific skin eruption 08/31/2019  . Weight gain 11/22/2018  . Lymphadenopathy, axillary 11/22/2018  . Seasonal allergies 03/29/2018  . Acute stress reaction 03/02/2018   Past Medical History:  Diagnosis Date  . BCC (basal cell carcinoma of skin)   . History of asthma   . History of pre-eclampsia    Past Surgical History:  Procedure Laterality Date  . CESAREAN SECTION    . TONSILLECTOMY  2005   Social History   Tobacco Use  . Smoking status: Former Smoker    Packs/day: 0.25    Years: 5.00    Pack years: 1.25    Types:  Cigarettes    Quit date: 01/20/2013    Years since quitting: 6.6  . Smokeless tobacco: Never Used  Vaping Use  . Vaping Use: Never used  Substance Use Topics  . Alcohol use: Yes    Comment: 1-2 times a week, 1-2 glasses  . Drug use: Never   Family History  Problem Relation Age of Onset  . Emphysema Paternal Grandmother   . Crohn's disease Paternal Grandmother   . Depression Mother   . Basal cell carcinoma Father   . Depression Sister   . Alzheimer's disease Maternal Grandmother   . Alcohol abuse Maternal Grandfather   . Suicidality Maternal Grandfather   . Colon cancer Paternal Grandfather 1  . Breast cancer Neg Hx    No Known Allergies Current Outpatient Medications on File Prior to Visit  Medication Sig Dispense Refill  . ALPRAZolam (XANAX) 0.5 MG tablet SMARTSIG:0.5-1 Tablet(s) By Mouth PRN    . propranolol (INDERAL) 10 MG tablet SMARTSIG:0.5-1 Tablet(s) By Mouth PRN     No current facility-administered medications on file prior to visit.   Review of Systems  Constitutional: Negative for chills, fever and malaise/fatigue.  HENT: Negative for congestion, ear pain, sinus pain and sore throat.   Eyes: Negative for blurred vision,  discharge and redness.  Respiratory: Negative for cough, shortness of breath and stridor.   Cardiovascular: Negative for chest pain, palpitations and leg swelling.  Gastrointestinal: Negative for abdominal pain, diarrhea, nausea and vomiting.  Musculoskeletal: Negative for back pain and myalgias.  Skin: Positive for rash.  Neurological: Negative for dizziness and headaches.    Observations/Objective: Patient appears well, in no distress Weight is baseline  No facial swelling or asymmetry Normal voice-not hoarse and no slurred speech No obvious tremor or mobility impairment Moving neck and UEs normally Able to hear the call well  No cough or shortness of breath during interview  Talkative and mentally sharp with no cognitive changes Affect  is normal  Skin: L shoulder- hyperpigmented dime sized lump w/o scab R lateral leg -tan papule and inferior to that 1 cm area of erythema and scab  L buttock -quarter size area of pink induration R dorsal hand -resolving scabbed dime size pink  Posterior R thigh-larger area of erythema and induration (2-4 cm) with 2 small papules in the middle  No rash on trunk/abd or back or face   Assessment and Plan: Problem List Items Addressed This Visit      Musculoskeletal and Integument   Rash and nonspecific skin eruption    Suspect allergic reaction to insect bites plus/minus folliculitis (in vacation/swimming lake and water park)  Recommend antihistamine Frequent soap/water cleanse Cold compress Insect repellent  Keflex 500 mg tid for 7 d sent Also triamcinolone to use bid topically  If worse-inst to go to urgent care there Otherwise f/u in office when returning as needed          Follow Up Instructions: For itch- try claritin or zyrtec over the counter Keep the lesions clean with soap and water Stay cool Take keflex as directed for skin infection Use triamcinolone cream for itch/inflammation of bites Wear insect repellent if needed  If worse-find an urgent care center near you  Follow up here upon return if needed   I discussed the assessment and treatment plan with the patient. The patient was provided an opportunity to ask questions and all were answered. The patient agreed with the plan and demonstrated an understanding of the instructions.   The patient was advised to call back or seek an in-person evaluation if the symptoms worsen or if the condition fails to improve as anticipated.     Laura Pardon, MD

## 2019-08-31 NOTE — Assessment & Plan Note (Signed)
Suspect allergic reaction to insect bites plus/minus folliculitis (in [place])  Recommend antihistamine Frequent soap/water cleanse Cold compress Insect repellent  Keflex 500 mg tid for 7 d sent Also triamcinolone to use bid topically  If worse-inst to go to urgent care there Otherwise f/u in office when returning as needed

## 2019-08-31 NOTE — Patient Instructions (Addendum)
For itch- try claritin or zyrtec over the counter Keep the lesions clean with soap and water Stay cool Take keflex as directed for skin infection Use triamcinolone cream for itch/inflammation of bites Wear insect repellent if needed  If worse-find an urgent care center near you  Follow up here upon return if needed

## 2019-09-14 DIAGNOSIS — F4323 Adjustment disorder with mixed anxiety and depressed mood: Secondary | ICD-10-CM | POA: Diagnosis not present

## 2019-09-16 DIAGNOSIS — Z20822 Contact with and (suspected) exposure to covid-19: Secondary | ICD-10-CM | POA: Diagnosis not present

## 2019-09-16 DIAGNOSIS — N390 Urinary tract infection, site not specified: Secondary | ICD-10-CM | POA: Diagnosis not present

## 2019-09-16 DIAGNOSIS — R300 Dysuria: Secondary | ICD-10-CM | POA: Diagnosis not present

## 2019-09-29 DIAGNOSIS — F4323 Adjustment disorder with mixed anxiety and depressed mood: Secondary | ICD-10-CM | POA: Diagnosis not present

## 2019-10-13 DIAGNOSIS — F4323 Adjustment disorder with mixed anxiety and depressed mood: Secondary | ICD-10-CM | POA: Diagnosis not present

## 2019-10-17 DIAGNOSIS — F4323 Adjustment disorder with mixed anxiety and depressed mood: Secondary | ICD-10-CM | POA: Diagnosis not present

## 2019-10-17 DIAGNOSIS — F40248 Other situational type phobia: Secondary | ICD-10-CM | POA: Diagnosis not present

## 2019-10-17 DIAGNOSIS — F410 Panic disorder [episodic paroxysmal anxiety] without agoraphobia: Secondary | ICD-10-CM | POA: Diagnosis not present

## 2019-10-17 DIAGNOSIS — F3281 Premenstrual dysphoric disorder: Secondary | ICD-10-CM | POA: Diagnosis not present

## 2019-10-26 DIAGNOSIS — F4323 Adjustment disorder with mixed anxiety and depressed mood: Secondary | ICD-10-CM | POA: Diagnosis not present

## 2019-11-15 ENCOUNTER — Ambulatory Visit: Payer: BC Managed Care – PPO | Admitting: Family Medicine

## 2019-11-18 ENCOUNTER — Encounter: Payer: Self-pay | Admitting: Radiology

## 2019-12-07 DIAGNOSIS — F4323 Adjustment disorder with mixed anxiety and depressed mood: Secondary | ICD-10-CM | POA: Diagnosis not present

## 2019-12-21 DIAGNOSIS — F4323 Adjustment disorder with mixed anxiety and depressed mood: Secondary | ICD-10-CM | POA: Diagnosis not present

## 2020-01-04 DIAGNOSIS — F4323 Adjustment disorder with mixed anxiety and depressed mood: Secondary | ICD-10-CM | POA: Diagnosis not present

## 2020-01-21 DIAGNOSIS — U071 COVID-19: Secondary | ICD-10-CM

## 2020-01-21 HISTORY — DX: COVID-19: U07.1

## 2020-02-01 DIAGNOSIS — F4323 Adjustment disorder with mixed anxiety and depressed mood: Secondary | ICD-10-CM | POA: Diagnosis not present

## 2020-02-29 DIAGNOSIS — F4323 Adjustment disorder with mixed anxiety and depressed mood: Secondary | ICD-10-CM | POA: Diagnosis not present

## 2020-03-27 DIAGNOSIS — F40248 Other situational type phobia: Secondary | ICD-10-CM | POA: Diagnosis not present

## 2020-03-27 DIAGNOSIS — F410 Panic disorder [episodic paroxysmal anxiety] without agoraphobia: Secondary | ICD-10-CM | POA: Diagnosis not present

## 2020-03-27 DIAGNOSIS — F3281 Premenstrual dysphoric disorder: Secondary | ICD-10-CM | POA: Diagnosis not present

## 2020-03-27 DIAGNOSIS — F4323 Adjustment disorder with mixed anxiety and depressed mood: Secondary | ICD-10-CM | POA: Diagnosis not present

## 2020-03-28 DIAGNOSIS — F4323 Adjustment disorder with mixed anxiety and depressed mood: Secondary | ICD-10-CM | POA: Diagnosis not present

## 2020-04-11 DIAGNOSIS — F3281 Premenstrual dysphoric disorder: Secondary | ICD-10-CM | POA: Diagnosis not present

## 2020-04-11 DIAGNOSIS — F40248 Other situational type phobia: Secondary | ICD-10-CM | POA: Diagnosis not present

## 2020-04-11 DIAGNOSIS — F410 Panic disorder [episodic paroxysmal anxiety] without agoraphobia: Secondary | ICD-10-CM | POA: Diagnosis not present

## 2020-04-11 DIAGNOSIS — F4323 Adjustment disorder with mixed anxiety and depressed mood: Secondary | ICD-10-CM | POA: Diagnosis not present

## 2020-04-16 ENCOUNTER — Telehealth: Payer: Self-pay | Admitting: Family Medicine

## 2020-04-16 NOTE — Telephone Encounter (Signed)
Laura Fuentes called in wanted to know about bringing in a stool sample when she comes in on Wednesday for her appointment.  Please advise

## 2020-04-16 NOTE — Telephone Encounter (Signed)
Is she having diarrhea? Visit notes do not indicate this.   Would probably recommend having visit and then we can have a follow-up stool sample if needed.

## 2020-04-17 DIAGNOSIS — M545 Low back pain, unspecified: Secondary | ICD-10-CM

## 2020-04-17 DIAGNOSIS — H538 Other visual disturbances: Secondary | ICD-10-CM

## 2020-04-17 DIAGNOSIS — R197 Diarrhea, unspecified: Secondary | ICD-10-CM

## 2020-04-17 DIAGNOSIS — R768 Other specified abnormal immunological findings in serum: Secondary | ICD-10-CM

## 2020-04-17 DIAGNOSIS — R634 Abnormal weight loss: Secondary | ICD-10-CM

## 2020-04-17 DIAGNOSIS — R7982 Elevated C-reactive protein (CRP): Secondary | ICD-10-CM

## 2020-04-17 DIAGNOSIS — R1030 Lower abdominal pain, unspecified: Secondary | ICD-10-CM

## 2020-04-17 DIAGNOSIS — R112 Nausea with vomiting, unspecified: Secondary | ICD-10-CM

## 2020-04-17 NOTE — Telephone Encounter (Signed)
mychart message sent to pt. Awaiting reply.

## 2020-04-18 ENCOUNTER — Ambulatory Visit: Payer: BC Managed Care – PPO | Admitting: Family Medicine

## 2020-04-19 ENCOUNTER — Ambulatory Visit: Payer: BC Managed Care – PPO | Admitting: Family Medicine

## 2020-04-24 ENCOUNTER — Ambulatory Visit (INDEPENDENT_AMBULATORY_CARE_PROVIDER_SITE_OTHER): Payer: BC Managed Care – PPO | Admitting: Family Medicine

## 2020-04-24 ENCOUNTER — Encounter: Payer: Self-pay | Admitting: Family Medicine

## 2020-04-24 ENCOUNTER — Other Ambulatory Visit: Payer: Self-pay

## 2020-04-24 VITALS — BP 100/70 | HR 61 | Temp 97.0°F | Ht 69.0 in | Wt 178.5 lb

## 2020-04-24 DIAGNOSIS — R3121 Asymptomatic microscopic hematuria: Secondary | ICD-10-CM | POA: Diagnosis not present

## 2020-04-24 DIAGNOSIS — K5900 Constipation, unspecified: Secondary | ICD-10-CM

## 2020-04-24 DIAGNOSIS — R1030 Lower abdominal pain, unspecified: Secondary | ICD-10-CM

## 2020-04-24 LAB — POC URINALSYSI DIPSTICK (AUTOMATED)
Bilirubin, UA: NEGATIVE
Blood, UA: POSITIVE
Glucose, UA: NEGATIVE
Ketones, UA: NEGATIVE
Leukocytes, UA: NEGATIVE
Nitrite, UA: NEGATIVE
Protein, UA: NEGATIVE
Spec Grav, UA: <=1.005 — AB (ref 1.010–1.025)
Urobilinogen, UA: 0.2 E.U./dL
pH, UA: 6 (ref 5.0–8.0)

## 2020-04-24 LAB — CBC
HCT: 43.6 % (ref 36.0–46.0)
Hemoglobin: 14.8 g/dL (ref 12.0–15.0)
MCHC: 34 g/dL (ref 30.0–36.0)
MCV: 90.5 fl (ref 78.0–100.0)
Platelets: 383 10*3/uL (ref 150.0–400.0)
RBC: 4.82 Mil/uL (ref 3.87–5.11)
RDW: 12.1 % (ref 11.5–15.5)
WBC: 6.6 10*3/uL (ref 4.0–10.5)

## 2020-04-24 LAB — COMPREHENSIVE METABOLIC PANEL
ALT: 20 U/L (ref 0–35)
AST: 15 U/L (ref 0–37)
Albumin: 4.5 g/dL (ref 3.5–5.2)
Alkaline Phosphatase: 94 U/L (ref 39–117)
BUN: 10 mg/dL (ref 6–23)
CO2: 29 mEq/L (ref 19–32)
Calcium: 9.6 mg/dL (ref 8.4–10.5)
Chloride: 104 mEq/L (ref 96–112)
Creatinine, Ser: 0.85 mg/dL (ref 0.40–1.20)
GFR: 86.3 mL/min (ref >60.00)
Glucose, Bld: 87 mg/dL (ref 70–99)
Potassium: 5.1 mEq/L (ref 3.5–5.1)
Sodium: 139 mEq/L (ref 135–145)
Total Bilirubin: 0.4 mg/dL (ref 0.2–1.2)
Total Protein: 7.4 g/dL (ref 6.0–8.3)

## 2020-04-24 LAB — MAGNESIUM: Magnesium: 2.1 mg/dL (ref 1.5–2.5)

## 2020-04-24 LAB — TSH: TSH: 2.5 u[IU]/mL (ref 0.35–4.50)

## 2020-04-24 NOTE — Assessment & Plan Note (Addendum)
Pain improves with BM/gas and occasionally worsened with food. Overall soft abdomen. Notes more difficulty passing stool so ?larger stool burden. Will get labs to evaluate for signs of infection or more worrisome cause. Advise fiber, prunes, stool softener to get back to normal bowel habit. Family hx of colon cancer and crohns in grandparents so if no improvement will consider FOBT and fecal calprotectin and imaging if abdominal pain worsening. UA normal and done due to suprapubic ttp.

## 2020-04-24 NOTE — Assessment & Plan Note (Signed)
Blood in urine. Will consider additional work-up if no other signs on blood work. Pt w/o urinary symptoms.

## 2020-04-24 NOTE — Patient Instructions (Addendum)
Ibuprofen 600 mg every 8 hours as needed  Gas-x - as needed too   Constipation   Constipation is a common issue. Often it is related to diet and occasionally medications.   What you can do to treat your symptoms 1) Fiber -- Eat more fiber rich foods: beans, broccoli, berries, avocados, popcorn, pear/apple, green peas, turnip greens, brussels sprouts, whole grains (barley, bran, quinoa, oatmeal) -- Take a Fiber supplement: Psyllium (Metamucil)  -- Could also eat Prunes daily  2) Hydration  -- Drink more water: Try to drink 64 oz of water per day  3) Exercise -- Moderate exercise (walking, jogging, biking) for 30 minutes, 5 days a week  4) Dedicate time for Bowel movements - do not delay  5) Stool Softener  - Docusate Sodium (Colace) 100 mg daily or twice daily as needed   Treating chronic constipation is often about finding the right amount of medication and fiber to keep you regular and comfortable. For some people that may be daily metamucil and colace every other day. For others it may be Metamucil and colace twice daily and Miralax 3 times a week. The goal is to go slow and listen to your body. And normal can be anywhere from 2-3 soft bowel movements a day to 1 bowel movement every 2-3 days.

## 2020-04-24 NOTE — Progress Notes (Signed)
Subjective:     Laura Fuentes is a 40 y.o. female presenting for Abdominal Pain (X 3 weeks feels gassy but not easy to pass gas. No diarrhea. Bm's daily.)     Abdominal Pain This is a new problem. The current episode started 1 to 4 weeks ago. The problem occurs constantly (worse after eating). The pain is located in the suprapubic region. The quality of the pain is cramping and aching. The abdominal pain does not radiate. Associated symptoms include constipation and nausea. Pertinent negatives include no diarrhea or vomiting. The pain is aggravated by eating (prozac). The pain is relieved by bowel movements and passing flatus. Treatments tried: heating pad. The treatment provided mild relief.   Tried prozac for 5 days Stopped this and restarted a probiotic and magnesium and bromalin  Met with a functional medicine doctor last week - stopped all supplements  Takes longer and more effort to pass a BM - firm and solid   Cycle is 25 days  Heavy for 3 days  Similar cramping during her menses to what she has been experiencing now Mood symptoms leading up to her period   Has noticed that she has been having rashes and more wrist/hand pain   Review of Systems  Gastrointestinal: Positive for abdominal pain, constipation and nausea. Negative for anal bleeding, blood in stool, diarrhea and vomiting.     Social History   Tobacco Use  Smoking Status Former Smoker  . Packs/day: 0.25  . Years: 5.00  . Pack years: 1.25  . Types: Cigarettes  . Quit date: 01/20/2013  . Years since quitting: 7.2  Smokeless Tobacco Never Used        Objective:    BP Readings from Last 3 Encounters:  04/24/20 100/70  01/19/19 112/73  11/22/18 114/86   Wt Readings from Last 3 Encounters:  04/24/20 178 lb 8 oz (81 kg)  01/19/19 189 lb (85.7 kg)  11/22/18 194 lb 8 oz (88.2 kg)    BP 100/70   Pulse 61   Temp (!) 97 F (36.1 C) (Temporal)   Ht 5' 9" (1.753 m)   Wt 178 lb 8 oz (81 kg)   LMP  04/19/2020 (Exact Date)   SpO2 98%   BMI 26.36 kg/m    Physical Exam Constitutional:      General: She is not in acute distress.    Appearance: She is well-developed. She is not diaphoretic.  HENT:     Right Ear: External ear normal.     Left Ear: External ear normal.     Nose: Nose normal.  Eyes:     Conjunctiva/sclera: Conjunctivae normal.  Cardiovascular:     Rate and Rhythm: Normal rate and regular rhythm.     Heart sounds: No murmur heard.   Pulmonary:     Effort: Pulmonary effort is normal. No respiratory distress.     Breath sounds: Normal breath sounds. No wheezing.  Abdominal:     General: Abdomen is flat. Bowel sounds are normal.     Palpations: Abdomen is soft.     Tenderness: There is abdominal tenderness in the right lower quadrant, suprapubic area and left lower quadrant. There is no guarding or rebound.  Musculoskeletal:     Cervical back: Neck supple.  Skin:    General: Skin is warm and dry.     Capillary Refill: Capillary refill takes less than 2 seconds.  Neurological:     Mental Status: She is alert. Mental status is at baseline.  Psychiatric:        Mood and Affect: Mood normal.        Behavior: Behavior normal.           Assessment & Plan:   Problem List Items Addressed This Visit      Genitourinary   Asymptomatic microscopic hematuria    Blood in urine. Will consider additional work-up if no other signs on blood work. Pt w/o urinary symptoms.         Other   Lower abdominal pain - Primary    Pain improves with BM/gas and occasionally worsened with food. Overall soft abdomen. Notes more difficulty passing stool so ?larger stool burden. Will get labs to evaluate for signs of infection or more worrisome cause. Advise fiber, prunes, stool softener to get back to normal bowel habit. Family hx of colon cancer and crohns in grandparents so if no improvement will consider FOBT and fecal calprotectin and imaging if abdominal pain worsening. UA  normal and done due to suprapubic ttp.       Relevant Orders   CBC   Comprehensive metabolic panel   Magnesium   TSH   POCT Urinalysis Dipstick (Automated) (Completed)    Other Visit Diagnoses    Constipation, unspecified constipation type       Relevant Orders   CBC   Comprehensive metabolic panel   Magnesium   TSH       Return if symptoms worsen or fail to improve.  Lesleigh Noe, MD  This visit occurred during the SARS-CoV-2 public health emergency.  Safety protocols were in place, including screening questions prior to the visit, additional usage of staff PPE, and extensive cleaning of exam room while observing appropriate contact time as indicated for disinfecting solutions.

## 2020-04-24 NOTE — Telephone Encounter (Signed)
Pt was seen in office today.

## 2020-04-28 ENCOUNTER — Encounter (HOSPITAL_COMMUNITY): Payer: Self-pay | Admitting: Emergency Medicine

## 2020-04-28 ENCOUNTER — Emergency Department (HOSPITAL_COMMUNITY)
Admission: EM | Admit: 2020-04-28 | Discharge: 2020-04-28 | Disposition: A | Discharge status: Home / Self Care | Payer: BC Managed Care – PPO | Attending: Emergency Medicine | Admitting: Emergency Medicine

## 2020-04-28 ENCOUNTER — Other Ambulatory Visit: Payer: Self-pay

## 2020-04-28 DIAGNOSIS — R110 Nausea: Secondary | ICD-10-CM | POA: Diagnosis not present

## 2020-04-28 DIAGNOSIS — R197 Diarrhea, unspecified: Secondary | ICD-10-CM | POA: Insufficient documentation

## 2020-04-28 DIAGNOSIS — Z87891 Personal history of nicotine dependence: Secondary | ICD-10-CM | POA: Diagnosis not present

## 2020-04-28 DIAGNOSIS — R1030 Lower abdominal pain, unspecified: Secondary | ICD-10-CM | POA: Insufficient documentation

## 2020-04-28 LAB — URINALYSIS, ROUTINE W REFLEX MICROSCOPIC
Bilirubin Urine: NEGATIVE
Glucose, UA: NEGATIVE mg/dL
Hgb urine dipstick: NEGATIVE
Ketones, ur: NEGATIVE mg/dL
Leukocytes,Ua: NEGATIVE
Nitrite: NEGATIVE
Protein, ur: NEGATIVE mg/dL
Specific Gravity, Urine: 1.004 — ABNORMAL LOW (ref 1.005–1.030)
pH: 8 (ref 5.0–8.0)

## 2020-04-28 LAB — COMPREHENSIVE METABOLIC PANEL
ALT: 17 U/L (ref 0–44)
AST: 17 U/L (ref 15–41)
Albumin: 4.3 g/dL (ref 3.5–5.0)
Alkaline Phosphatase: 98 U/L (ref 38–126)
Anion gap: 4 — ABNORMAL LOW (ref 5–15)
BUN: 6 mg/dL (ref 6–20)
CO2: 30 mmol/L (ref 22–32)
Calcium: 9.9 mg/dL (ref 8.9–10.3)
Chloride: 104 mmol/L (ref 98–111)
Creatinine, Ser: 0.75 mg/dL (ref 0.44–1.00)
GFR, Estimated: >60 mL/min (ref >60)
Glucose, Bld: 95 mg/dL (ref 70–99)
Potassium: 4.3 mmol/L (ref 3.5–5.1)
Sodium: 138 mmol/L (ref 135–145)
Total Bilirubin: 0.6 mg/dL (ref 0.3–1.2)
Total Protein: 7.8 g/dL (ref 6.5–8.1)

## 2020-04-28 LAB — LIPASE, BLOOD: Lipase: 35 U/L (ref 11–51)

## 2020-04-28 LAB — CBC
HCT: 44.4 % (ref 36.0–46.0)
Hemoglobin: 14.9 g/dL (ref 12.0–15.0)
MCH: 30.4 pg (ref 26.0–34.0)
MCHC: 33.6 g/dL (ref 30.0–36.0)
MCV: 90.6 fL (ref 80.0–100.0)
Platelets: 379 10*3/uL (ref 150–400)
RBC: 4.9 MIL/uL (ref 3.87–5.11)
RDW: 11.6 % (ref 11.5–15.5)
WBC: 9.6 10*3/uL (ref 4.0–10.5)
nRBC: 0 % (ref 0.0–0.2)

## 2020-04-28 LAB — I-STAT BETA HCG BLOOD, ED (MC, WL, AP ONLY): I-stat hCG, quantitative: <5 m[IU]/mL (ref <5)

## 2020-04-28 MED ORDER — KETOROLAC TROMETHAMINE 15 MG/ML IJ SOLN
30.0000 mg | Freq: Once | INTRAMUSCULAR | Status: DC
  Filled 2020-04-28: qty 2

## 2020-04-28 MED ORDER — ONDANSETRON 4 MG PO TBDP
4.0000 mg | ORAL_TABLET | Freq: Once | ORAL | Status: DC
  Filled 2020-04-28: qty 1

## 2020-04-28 MED ORDER — ONDANSETRON HCL 4 MG/2ML IJ SOLN: 4.0000 mg | Freq: Once | INTRAMUSCULAR | Status: DC

## 2020-04-28 MED ORDER — KETOROLAC TROMETHAMINE 15 MG/ML IJ SOLN: 15.0000 mg | Freq: Once | INTRAMUSCULAR | Status: DC

## 2020-04-28 MED ORDER — DICYCLOMINE HCL 20 MG PO TABS: 20.0000 mg | ORAL_TABLET | Freq: Two times a day (BID) | ORAL | 0 refills | Status: DC

## 2020-04-28 NOTE — ED Provider Notes (Signed)
[name] Provider Note   CSN: [id number] Arrival date & time: 04/28/20  1215     History No chief complaint on file.   Laura Fuentes is a 40 y.o. female.  Patient presents the emergency department today for evaluation of lower abdominal pain.  Patient has been having lower abdominal pain over the past 4 weeks.  It is described as a pressure and cramping in her lower abdomen.  She reports associated nausea.  She has had nonbloody diarrhea over the past 3 to 4 days.  No dysuria, increased frequency or urgency, or hematuria.  She has had occasional episodes of vomiting, however these are sporadic.  This morning the pain in her lower abdomen was worse and it radiated into her back bilaterally.  She states that she went to her functional medicine doctor and went to her PCP where she had labs drawn.  These were reportedly normal.  She was started on some Colace which really has not helped her.  She was supposed to follow-up in the next 1 to 2 weeks with PCP.  No history of abdominal surgeries.  She denies vaginal bleeding or discharge currently.  Onset of symptoms intermittent.  Course was worse today.        Past Medical History:  Diagnosis Date  . BCC (basal cell carcinoma of skin)   . History of asthma   . History of pre-eclampsia     Patient Active Problem List   Diagnosis Date Noted  . Lower abdominal pain 04/24/2020  . Asymptomatic microscopic hematuria 04/24/2020  . Rash and nonspecific skin eruption 08/31/2019  . Weight gain 11/22/2018  . Lymphadenopathy, axillary 11/22/2018  . Seasonal allergies 03/29/2018  . Acute stress reaction 03/02/2018    Past Surgical History:  Procedure Laterality Date  . CESAREAN SECTION    . TONSILLECTOMY  2005     OB History    Gravida  1   Para  1   Term      Preterm  1   AB      Living  2     SAB      IAB      Ectopic      Multiple      Live Births  2           Family  History  Problem Relation Age of Onset  . Emphysema Paternal Grandmother   . Crohn's disease Paternal Grandmother   . Depression Mother   . Basal cell carcinoma Father   . Depression Sister   . Alzheimer's disease Maternal Grandmother   . Alcohol abuse Maternal Grandfather   . Suicidality Maternal Grandfather   . Colon cancer Paternal Grandfather 16  . Breast cancer Neg Hx     Social History   Tobacco Use  . Smoking status: Former Smoker    Packs/day: 0.25    Years: 5.00    Pack years: 1.25    Types: Cigarettes    Quit date: 01/20/2013    Years since quitting: 7.2  . Smokeless tobacco: Never Used  Vaping Use  . Vaping Use: Never used  Substance Use Topics  . Alcohol use: Yes    Comment: 1-2 times a week, 1-2 glasses  . Drug use: Never    Home Medications Prior to Admission medications   Medication Sig Start Date End Date Taking? Authorizing Provider  ALPRAZolam Duanne Moron) 0.5 MG tablet SMARTSIG:0.5-1 Tablet(s) By Mouth PRN 10/21/18   [provider]  Allergies    Patient has no known allergies.  Review of Systems   Review of Systems  Constitutional: Negative for fever.  HENT: Negative for rhinorrhea and sore throat.   Eyes: Negative for redness.  Respiratory: Negative for cough.   Cardiovascular: Negative for chest pain.  Gastrointestinal: Positive for abdominal pain, diarrhea, nausea and vomiting (Sporadic). Negative for constipation.  Genitourinary: Negative for dysuria, frequency, hematuria and urgency.  Musculoskeletal: Positive for back pain. Negative for myalgias.  Skin: Negative for rash.  Neurological: Negative for headaches.    Physical Exam Updated Vital Signs BP (!) 138/99   Pulse 75   Temp 98.7 F (37.1 C)   Resp 16   LMP 04/19/2020 (Exact Date)   SpO2 100%   Physical Exam Vitals and nursing note reviewed.  Constitutional:      General: She is not in acute distress.    Appearance: She is well-developed.  HENT:     Head:  Normocephalic and atraumatic.     Right Ear: External ear normal.     Left Ear: External ear normal.     Nose: Nose normal.  Eyes:     Conjunctiva/sclera: Conjunctivae normal.  Cardiovascular:     Rate and Rhythm: Normal rate and regular rhythm.     Heart sounds: No murmur heard.   Pulmonary:     Effort: No respiratory distress.     Breath sounds: No wheezing, rhonchi or rales.  Abdominal:     Palpations: Abdomen is soft.     Tenderness: There is abdominal tenderness. There is no guarding or rebound.     Comments: Mild lower/suprapubic  Musculoskeletal:     Cervical back: Normal range of motion and neck supple.     Right lower leg: No edema.     Left lower leg: No edema.  Skin:    General: Skin is warm and dry.     Findings: No rash.  Neurological:     General: No focal deficit present.     Mental Status: She is alert. Mental status is at baseline.     Motor: No weakness.  Psychiatric:        Mood and Affect: Mood normal.     ED Results / Procedures / Treatments   Labs (all labs ordered are listed, but only abnormal results are displayed) Labs Reviewed  COMPREHENSIVE METABOLIC PANEL - Abnormal; Notable for the following components:      Result Value   Anion gap 4 (*)    All other components within normal limits  LIPASE, BLOOD  CBC  URINALYSIS, ROUTINE W REFLEX MICROSCOPIC  I-STAT BETA HCG BLOOD, ED ([facility], [facility], [facility] ONLY)    EKG None  Radiology No results found.  Procedures Procedures   Medications Ordered in ED Medications  ketorolac (TORADOL) 15 MG/ML injection 30 mg (has no administration in time range)  ondansetron (ZOFRAN-ODT) disintegrating tablet 4 mg (has no administration in time range)    ED Course  I have reviewed the triage vital signs and the nursing notes.  Pertinent labs & imaging results that were available during my care of the patient were reviewed by me and considered in my medical decision making (see chart for details).  Patient seen  and examined. Work-up initiated. Medications ordered.   Vital signs reviewed and are as follows: BP (!) 138/99   Pulse 75   Temp 98.7 F (37.1 C)   Resp 16   LMP 04/19/2020 (Exact Date)   SpO2 100%   Pending  UA, reviewed results of blood work with patient at bedside.  Discussed how to proceed at this point.  This includes referral to GI with antispasmodic medication.  Offered patient CT imaging today to screen for potential etiologies of abdominal pain, cautioning that many causes of chronic abdominal pain are not readily identified by CT imaging and require appropriate follow-up.  After discussion, patient would like to defer imaging today, continue symptomatic treatment and follow-up with GI.  Will give IM toradol, ODT zofran.   2:14 PM Notified by NT that patient is up, getting ready to leave. She has not had medications and UA has not resulted. I prepared d/c paperwork, GI referral and sent in rx for Bentyl to try at home.   The patient was urged to return to the Emergency Department immediately with worsening of current symptoms, worsening abdominal pain, persistent vomiting, blood noted in stools, fever, or any other concerns. The patient verbalized understanding.   2:40 PM UA negative.     MDM Rules/Calculators/A&P                          Patient with abdominal pain x 4 weeks, worse today with radiation to back. Vitals are stable, no fever. Labs normal blood work, not pregnant, UA not resulted prior to patient departure. Imaging offered, pt declines. No signs of dehydration, patient is tolerating PO's. Lungs are clear and no signs suggestive of PNA. Low concern for appendicitis, cholecystitis, pancreatitis, ruptured viscus, UTI, kidney stone, aortic dissection, aortic aneurysm or other emergent abdominal etiology. Supportive therapy indicated with return if symptoms worsen.    Final Clinical Impression(s) / ED Diagnoses Final diagnoses:  Lower abdominal pain    Rx / DC  Orders ED Discharge Orders         Ordered    dicyclomine (BENTYL) 20 MG tablet  2 times daily        04/28/20 1413           Carlisle Cater, PA-C 04/28/20 1440    Pattricia Boss, MD 04/30/20 1409

## 2020-04-28 NOTE — Discharge Instructions (Signed)
Please read and follow all provided instructions.  Your diagnoses today include:  1. Lower abdominal pain     Tests performed today include:  Blood cell counts and platelets  Kidney and liver function tests  Pancreas function test (called lipase)  Urine test to look for infection - not resulted   A blood or urine test for pregnancy (women only)  Vital signs. See below for your results today.   Medications prescribed:   Bentyl - medication for intestinal cramps and spasms  Take any prescribed medications only as directed.  Home care instructions:   Follow any educational materials contained in this packet.  Follow-up instructions: Please follow-up with your primary care provider in the next 3 days for further evaluation of your symptoms.    Follow-up with GI referral when able.   Return instructions:  SEEK IMMEDIATE MEDICAL ATTENTION IF:  The pain does not go away or becomes severe   A temperature above 101F develops   Repeated vomiting occurs (multiple episodes)   The pain becomes localized to portions of the abdomen. The right side could possibly be appendicitis. In an adult, the left lower portion of the abdomen could be colitis or diverticulitis.   Blood is being passed in stools or vomit (bright red or black tarry stools)   You develop chest pain, difficulty breathing, dizziness or fainting, or become confused, poorly responsive, or inconsolable (young children)  If you have any other emergent concerns regarding your health  Additional Information: Abdominal (belly) pain can be caused by many things. Your caregiver performed an examination and possibly ordered blood/urine tests and imaging (CT scan, x-rays, ultrasound). Many cases can be observed and treated at home after initial evaluation in the emergency department. Even though you are being discharged home, abdominal pain can be unpredictable. Therefore, you need a repeated exam if your pain does not  resolve, returns, or worsens. Most patients with abdominal pain don't have to be admitted to the hospital or have surgery, but serious problems like appendicitis and gallbladder attacks can start out as nonspecific pain. Many abdominal conditions cannot be diagnosed in one visit, so follow-up evaluations are very important.  Your vital signs today were: BP (!) 138/99   Pulse 75   Temp 98.7 F (37.1 C)   Resp 16   LMP 04/19/2020 (Exact Date)   SpO2 100%  If your blood pressure (bp) was elevated above 135/85 this visit, please have this repeated by your doctor within one month. --------------

## 2020-04-28 NOTE — ED Triage Notes (Signed)
C/o lower abd pain since 3/13.  Pain radiates to lower back that is worse this morning.  Reports nausea, diarrhea.  Denies urinary complaints.  Also reports headache and fatigue.

## 2020-04-28 NOTE — ED Notes (Signed)
Pt refused IM Toradol and ODT Zofran. Pt A&OX4, ambulatory at d/c with independent steady gait, NAD. Pt verbalized understanding of d/c instructions, prescription and follow up care.

## 2020-04-30 ENCOUNTER — Other Ambulatory Visit: Payer: Self-pay | Admitting: Family Medicine

## 2020-04-30 DIAGNOSIS — R197 Diarrhea, unspecified: Secondary | ICD-10-CM

## 2020-05-01 NOTE — Addendum Note (Signed)
Addended by: Waunita Schooner R on: 05/01/2020 10:18 AM   Modules accepted: Orders

## 2020-05-02 ENCOUNTER — Ambulatory Visit: Payer: BC Managed Care – PPO | Admitting: Family Medicine

## 2020-05-02 ENCOUNTER — Other Ambulatory Visit: Payer: BC Managed Care – PPO

## 2020-05-02 DIAGNOSIS — R197 Diarrhea, unspecified: Secondary | ICD-10-CM

## 2020-05-03 MED ORDER — ONDANSETRON 4 MG PO TBDP: 4.0000 mg | ORAL_TABLET | Freq: Three times a day (TID) | ORAL | 0 refills | Status: DC | PRN

## 2020-05-03 NOTE — Addendum Note (Signed)
Addended by: Waunita Schooner R on: 05/03/2020 01:11 PM   Modules accepted: Orders

## 2020-05-07 ENCOUNTER — Telehealth: Payer: Self-pay

## 2020-05-07 ENCOUNTER — Encounter: Payer: Self-pay | Admitting: Family Medicine

## 2020-05-07 ENCOUNTER — Ambulatory Visit (INDEPENDENT_AMBULATORY_CARE_PROVIDER_SITE_OTHER): Payer: BC Managed Care – PPO | Admitting: Family Medicine

## 2020-05-07 ENCOUNTER — Other Ambulatory Visit: Payer: Self-pay

## 2020-05-07 VITALS — BP 120/80 | HR 88 | Temp 98.2°F | Wt 171.5 lb

## 2020-05-07 DIAGNOSIS — M2550 Pain in unspecified joint: Secondary | ICD-10-CM | POA: Insufficient documentation

## 2020-05-07 DIAGNOSIS — K529 Noninfective gastroenteritis and colitis, unspecified: Secondary | ICD-10-CM | POA: Diagnosis not present

## 2020-05-07 DIAGNOSIS — R634 Abnormal weight loss: Secondary | ICD-10-CM | POA: Diagnosis not present

## 2020-05-07 LAB — HIGH SENSITIVITY CRP: CRP, High Sensitivity: 57.98 mg/L — ABNORMAL HIGH (ref 0.000–5.000)

## 2020-05-07 LAB — VITAMIN B12: Vitamin B-12: 386 pg/mL (ref 211–911)

## 2020-05-07 LAB — FERRITIN: Ferritin: 125.6 ng/mL (ref 10.0–291.0)

## 2020-05-07 LAB — VITAMIN D 25 HYDROXY (VIT D DEFICIENCY, FRACTURES): VITD: 28.08 ng/mL — ABNORMAL LOW (ref 30.00–100.00)

## 2020-05-07 MED ORDER — HYDROCODONE-ACETAMINOPHEN 5-325 MG PO TABS: 1.0000 | ORAL_TABLET | Freq: Four times a day (QID) | ORAL | 0 refills | Status: DC | PRN

## 2020-05-07 MED ORDER — BUDESONIDE ER 9 MG PO TB24: 9.0000 mg | ORAL_TABLET | Freq: Every day | ORAL | 0 refills | Status: DC

## 2020-05-07 NOTE — Progress Notes (Signed)
Subjective:     Laura Fuentes is a 40 y.o. female presenting for Generalized Body Aches (Severe, full body pain, but worse in R leg and lower back /* Had covid end of January 2022), Abdominal Pain, Diarrhea (X 2 weeks ), and Rash (Raised red bumps, mostly on back, since last Thursday )     HPI  # Rash - has been getting blistering rashes over the last year - has been researching mast cell disorder  - would get flares with different things - heat, citrus, etc and has been avoiding those things - the rash has been there since Thursday  #Ulcer in mouth - no trauma - noticed Thursday  #Joint pain - having body aches - also having right leg pain 6/10 - can get worse to 10/10 - stopped ibuprofen - is taking tylenol - will also get elbow/knee pain on both sides randomly - initially shin pain but now thigh muscle pain - noticed the leg pain at the ER as well - also having low back pain  Blood shot eyes and pain with movement but this improved. Endorses some blurry vision as well and some floaters No dry eyes No dry mouth  Weight loss - low appetite - nausea - will have 4 small bowel movements - is waking up at night with diarrhea   Review of Systems  04/17/2020: Mychart message - 4/13 - chills (no fever), muscle/bone aches which are migratory. Weight loss 2/2 to low appetite. Bloodshot eyes and eye pain, HA. 4/17 - blistering rash, blood in stool Concern for mast cell disorder - 10/10 pain, abdominal cramping, leg pain, diarrhea, low back pain, elbow/knee pain, nausea w/ phlegm emesis, ulcers in mouth, rash, bloodshot eyes, HA, eye pain, fatigue. Avoiding gluten and dairy, stool w/ mucous  Social History   Tobacco Use  Smoking Status Former Smoker  . Packs/day: 0.25  . Years: 5.00  . Pack years: 1.25  . Types: Cigarettes  . Quit date: 01/20/2013  . Years since quitting: 7.2  Smokeless Tobacco Never Used        Objective:    BP Readings from Last 3 Encounters:   05/07/20 120/80  04/28/20 118/89  04/24/20 100/70   Wt Readings from Last 3 Encounters:  05/07/20 171 lb 8 oz (77.8 kg)  04/24/20 178 lb 8 oz (81 kg)  01/19/19 189 lb (85.7 kg)    BP 120/80   Pulse 88   Temp 98.2 F (36.8 C) (Temporal)   Wt 171 lb 8 oz (77.8 kg)   LMP 04/19/2020 (Exact Date)   SpO2 100%   BMI 25.33 kg/m    Physical Exam Constitutional:      General: She is not in acute distress.    Appearance: She is well-developed. She is not diaphoretic.  HENT:     Right Ear: External ear normal.     Left Ear: External ear normal.     Nose: Nose normal.     Mouth/Throat:     Comments: Moist with small ulcer on the interior lower left lip Eyes:     Conjunctiva/sclera: Conjunctivae normal.  Cardiovascular:     Rate and Rhythm: Normal rate and regular rhythm.     Heart sounds: No murmur heard.   Pulmonary:     Effort: Pulmonary effort is normal. No respiratory distress.     Breath sounds: Normal breath sounds. No wheezing.  Abdominal:     General: Abdomen is flat. Bowel sounds are normal. There is no distension.  Palpations: Abdomen is soft.     Tenderness: There is abdominal tenderness (lower abdominal). There is no guarding or rebound.  Musculoskeletal:     Cervical back: Neck supple.  Skin:    General: Skin is warm and dry.     Capillary Refill: Capillary refill takes less than 2 seconds.     Comments: Scattered pustule/vesicular lesions on the back with erythematous base.   Neurological:     Mental Status: She is alert. Mental status is at baseline.  Psychiatric:        Mood and Affect: Mood normal.        Behavior: Behavior normal.           Assessment & Plan:   Problem List Items Addressed This Visit      Digestive   Colitis - Primary    Pt with constellation of symptoms which presented with abdominal pain and have now progressed to include diarrhea, joint pain, fatigue, weight loss, poor appetite, chills, body aches, skin rash, oral  ulcers. Etiology not clear but concerning for possible autoimmune with predominate GI symptoms high concern for crohns or UC given family hx of crohn's disease.   Normal work-up: CMP, CBC, giardia Pending work-up: fecal calprotectin, stool pathogens Ordered today - will evaluate vitamins and some inflammatory markers. Pt requested lyme as some [place] travel - does not recall specific tick bite but is reasonable to rule-out.   Discussed consideration for CT abdomen but non-focal exam so unclear if this would provide diagnosis. Also discussed trial of budisasone however this was too expensive. Will await fecal calprotectin results before considering prednisone. If more evidence favoring crohn's will consider steroids while she waits for GI. Will also work to expedite GI referral. Hydrocodone for severe pain as symptoms are worsening.       Relevant Medications   Budesonide ER 9 MG TB24   HYDROcodone-acetaminophen (NORCO/VICODIN) 5-325 MG tablet   Other Relevant Orders   High sensitivity CRP (Completed)   Ferritin (Completed)   Vitamin D, 25-hydroxy (Completed)   Vitamin B12 (Completed)   B. burgdorfi antibodies by WB   Ambulatory referral to Gastroenterology     Other   Polyarthralgia   Relevant Medications   HYDROcodone-acetaminophen (NORCO/VICODIN) 5-325 MG tablet   Other Relevant Orders   ANA   B. burgdorfi antibodies by WB   Ambulatory referral to Gastroenterology   Weight loss   Relevant Medications   Budesonide ER 9 MG TB24   Other Relevant Orders   High sensitivity CRP (Completed)   Ferritin (Completed)   Vitamin D, 25-hydroxy (Completed)   Vitamin B12 (Completed)   ANA   B. burgdorfi antibodies by WB   Ambulatory referral to Gastroenterology     CRP elevated - will wait for fecal cal and reach out to GI.   Return if symptoms worsen or fail to improve.  Lesleigh Noe, MD  This visit occurred during the SARS-CoV-2 public health emergency.  Safety protocols were in  place, including screening questions prior to the visit, additional usage of staff PPE, and extensive cleaning of exam room while observing appropriate contact time as indicated for disinfecting solutions.

## 2020-05-07 NOTE — Assessment & Plan Note (Signed)
Pt with constellation of symptoms which presented with abdominal pain and have now progressed to include diarrhea, joint pain, fatigue, weight loss, poor appetite, chills, body aches, skin rash, oral ulcers. Etiology not clear but concerning for possible autoimmune with predominate GI symptoms high concern for crohns or UC given family hx of crohn's disease.   Normal work-up: CMP, CBC, giardia Pending work-up: fecal calprotectin, stool pathogens Ordered today - will evaluate vitamins and some inflammatory markers. Pt requested lyme as some [place] travel - does not recall specific tick bite but is reasonable to rule-out.   Discussed consideration for CT abdomen but non-focal exam so unclear if this would provide diagnosis. Also discussed trial of budisasone however this was too expensive. Will await fecal calprotectin results before considering prednisone. If more evidence favoring crohn's will consider steroids while she waits for GI. Will also work to expedite GI referral. Hydrocodone for severe pain as symptoms are worsening.

## 2020-05-07 NOTE — Telephone Encounter (Signed)
Patient left voicemail asking for an earlier appt date. Checked schedule and Dr. Allen Norris had no earlier openings in [place]. Returned patients call, no answer. I left a voicemail.

## 2020-05-07 NOTE — Patient Instructions (Addendum)
Steroids prescribed  Labs today  Will have my referral coordinator switch to urgent.   Steroids if indicated are for weeks, I've just prescribed 2 weeks to start but hopefully we will get you into GI before you need the next dose

## 2020-05-07 NOTE — Telephone Encounter (Signed)
Lvm asking patient to call office to schedule appt today

## 2020-05-08 ENCOUNTER — Encounter: Payer: Self-pay | Admitting: *Deleted

## 2020-05-08 NOTE — Addendum Note (Signed)
Addended by: Lesleigh Noe on: 05/08/2020 09:40 AM   Modules accepted: Orders

## 2020-05-09 ENCOUNTER — Ambulatory Visit: Payer: BC Managed Care – PPO | Admitting: Family Medicine

## 2020-05-09 ENCOUNTER — Other Ambulatory Visit: Payer: Self-pay | Admitting: Family Medicine

## 2020-05-09 DIAGNOSIS — R197 Diarrhea, unspecified: Secondary | ICD-10-CM

## 2020-05-09 NOTE — Progress Notes (Signed)
Patient will attempt to recollect new sample as delay in previous.   Routing to lab as [employer]

## 2020-05-09 NOTE — Addendum Note (Signed)
Addended by: Waunita Schooner R on: 05/09/2020 11:41 AM   Modules accepted: Orders

## 2020-05-10 ENCOUNTER — Ambulatory Visit (INDEPENDENT_AMBULATORY_CARE_PROVIDER_SITE_OTHER): Payer: BC Managed Care – PPO | Admitting: Gastroenterology

## 2020-05-10 ENCOUNTER — Encounter: Payer: Self-pay | Admitting: Gastroenterology

## 2020-05-10 ENCOUNTER — Other Ambulatory Visit: Payer: BC Managed Care – PPO

## 2020-05-10 ENCOUNTER — Other Ambulatory Visit: Payer: Self-pay

## 2020-05-10 ENCOUNTER — Telehealth: Payer: Self-pay

## 2020-05-10 VITALS — BP 120/81 | HR 97 | Temp 97.9°F | Ht 69.0 in | Wt 169.0 lb

## 2020-05-10 DIAGNOSIS — R197 Diarrhea, unspecified: Secondary | ICD-10-CM

## 2020-05-10 DIAGNOSIS — R21 Rash and other nonspecific skin eruption: Secondary | ICD-10-CM

## 2020-05-10 DIAGNOSIS — K529 Noninfective gastroenteritis and colitis, unspecified: Secondary | ICD-10-CM | POA: Diagnosis not present

## 2020-05-10 DIAGNOSIS — R768 Other specified abnormal immunological findings in serum: Secondary | ICD-10-CM | POA: Diagnosis not present

## 2020-05-10 DIAGNOSIS — R195 Other fecal abnormalities: Secondary | ICD-10-CM

## 2020-05-10 LAB — B. BURGDORFI ANTIBODIES BY WB
B burgdorferi IgG Abs (IB): NEGATIVE
B burgdorferi IgM Abs (IB): NEGATIVE
Lyme Disease 18 kD IgG: NONREACTIVE
Lyme Disease 23 kD IgG: NONREACTIVE
Lyme Disease 23 kD IgM: NONREACTIVE
Lyme Disease 28 kD IgG: NONREACTIVE
Lyme Disease 30 kD IgG: NONREACTIVE
Lyme Disease 39 kD IgG: REACTIVE — AB
Lyme Disease 39 kD IgM: NONREACTIVE
Lyme Disease 41 kD IgG: REACTIVE — AB
Lyme Disease 41 kD IgM: REACTIVE — AB
Lyme Disease 45 kD IgG: REACTIVE — AB
Lyme Disease 58 kD IgG: NONREACTIVE
Lyme Disease 66 kD IgG: REACTIVE — AB
Lyme Disease 93 kD IgG: NONREACTIVE

## 2020-05-10 LAB — ANTI-NUCLEAR AB-TITER (ANA TITER): ANA Titer 1: 1:40 {titer} — ABNORMAL HIGH

## 2020-05-10 LAB — ANA: Anti Nuclear Antibody (ANA): POSITIVE — AB

## 2020-05-10 MED ORDER — NA SULFATE-K SULFATE-MG SULF 17.5-3.13-1.6 GM/177ML PO SOLN: 354.0000 mL | Freq: Once | ORAL | 0 refills | Status: AC

## 2020-05-10 MED ORDER — PREDNISONE 20 MG PO TABS: 40.0000 mg | ORAL_TABLET | Freq: Every day | ORAL | 0 refills | Status: AC

## 2020-05-10 NOTE — Telephone Encounter (Signed)
Patient advised and verbalized understanding 

## 2020-05-10 NOTE — Telephone Encounter (Signed)
Problem List Items Addressed This Visit      Other   Lower abdominal pain   Relevant Orders   CT Abdomen Pelvis W Contrast   Weight loss   Relevant Orders   CT Abdomen Pelvis W Contrast    Other Visit Diagnoses    Watery diarrhea    -  Primary   Relevant Orders   CT Abdomen Pelvis W Contrast   Nausea and vomiting, intractability of vomiting not specified, unspecified vomiting type       Relevant Orders   CT Abdomen Pelvis W Contrast   Blurry vision       Relevant Orders   Ambulatory referral to Ophthalmology   Elevated C-reactive protein (CRP)       Relevant Orders   Ambulatory referral to Ophthalmology   ANA positive       Relevant Orders   Ambulatory referral to Rheumatology

## 2020-05-10 NOTE — Progress Notes (Addendum)
Cephas Darby, MD [address]  [place]  [place], [place] [address]  Main: [phone]  Fax: [phone]    Gastroenterology Consultation  Referring Provider:     Lesleigh Noe, MD Primary Care Physician:  Lesleigh Noe, MD Primary Gastroenterologist:  Dr. Cephas Darby Reason for Consultation:     Abdominal pain, diarrhea        HPI:   Laura Fuentes is a 40 y.o. female referred by Dr. Einar Pheasant, Jobe Marker, MD  for consultation & management of chronic medical history of lower abdominal pain associated with diarrhea.  Patient reports that her symptoms started on March 13th with predominantly lower abdominal pain associated with 5-6 episodes of diarrhea mixed with mucus blood-tinged fluid, nausea.  She does report significant arthralgias of large and small joints.  She is also experiencing congestion in her eyes associated with blurred vision, sometimes being in both eyes.  She also started noticing nodules on her bilateral lower legs which are not painful.  She is also developing papular rash on her skin on bilateral upper extremities as well as neck, upper back, nonpruritic, blanching.  She develops chills, denies any fever.  She lost about 10 pounds in last 2 weeks.  She moved from [place] to [place].  Patient reports that she has seen dermatologist in [place] 1 year ago, she was told that she may had shingles. Work-up thus far revealed significantly elevated CRP 57.46m/L, elevated fecal calprotectin level 786, Giardia antigen test for edema negative, she had elevated IgG Lyme's titers.  She was told that she does not meet the criteria for Lyme's disease.  Normal CBC, CMP, positive ANA with speckled pattern  Patient does not smoke or drink alcohol.  She is a wProbation officerby profession   NSAIDs: None  Antiplts/Anticoagulants/Anti thrombotics: None  GI Procedures: None Family history of IBD  Past Medical History:  Diagnosis Date  . BCC (basal cell carcinoma of skin)    . COVID-19 01/2020  . History of asthma   . History of pre-eclampsia     Past Surgical History:  Procedure Laterality Date  . CESAREAN SECTION    . TONSILLECTOMY  2005    Current Outpatient Medications:  .  ALPRAZolam (XANAX) 1 MG tablet, TAKE 1 TABLET BY MOUTH EVERY DAY AS NEEDED FOR SEVERE ANXIETY. MAX 20 TABS/ 30 DAYS, Disp: , Rfl:  .  dicyclomine (BENTYL) 20 MG tablet, Take 1 tablet (20 mg total) by mouth 2 (two) times daily. (Patient taking differently: Take 20 mg by mouth as needed.), Disp: 30 tablet, Rfl: 0 .  HYDROcodone-acetaminophen (NORCO/VICODIN) 5-325 MG tablet, Take 1 tablet by mouth every 6 (six) hours as needed for moderate pain., Disp: 30 tablet, Rfl: 0 .  Na Sulfate-K Sulfate-Mg Sulf 17.5-3.13-1.6 GM/177ML SOLN, Take 354 mLs by mouth once for 1 dose., Disp: 354 mL, Rfl: 0 .  ondansetron (ZOFRAN ODT) 4 MG disintegrating tablet, Take 1 tablet (4 mg total) by mouth every 8 (eight) hours as needed for nausea or vomiting., Disp: 20 tablet, Rfl: 0 .  predniSONE (DELTASONE) 20 MG tablet, Take 2 tablets (40 mg total) by mouth daily with breakfast., Disp: 60 tablet, Rfl: 0   Family History  Problem Relation Age of Onset  . Emphysema Paternal Grandmother   . Crohn's disease Paternal Grandmother   . Depression Mother   . Basal cell carcinoma Father   . Depression Sister   . Alzheimer's disease Maternal Grandmother   . Colon cancer Maternal  Grandmother 73  . Alcohol abuse Maternal Grandfather   . Suicidality Maternal Grandfather   . Colon cancer Paternal Grandfather 15  . Breast cancer Neg Hx      Social History   Tobacco Use  . Smoking status: Former Smoker    Packs/day: 0.25    Years: 5.00    Pack years: 1.25    Types: Cigarettes    Quit date: 01/20/2013    Years since quitting: 7.3  . Smokeless tobacco: Never Used  Vaping Use  . Vaping Use: Never used  Substance Use Topics  . Alcohol use: Not Currently    Comment: 1-2 times a week, 1-2 glasses  . Drug use:  Never    Allergies as of 05/10/2020 - Review Complete 05/10/2020  Allergen Reaction Noted  . Citrus Rash 05/10/2020    Review of Systems:    All systems reviewed and negative except where noted in HPI.   Physical Exam:  BP 120/81 (BP Location: Left Arm, Patient Position: Sitting, Cuff Size: Normal)   Pulse 97   Temp 97.9 F (36.6 C) (Oral)   Ht 5' 9"  (1.753 m)   Wt 169 lb (76.7 kg)   LMP 05/08/2020 (Exact Date)   BMI 24.96 kg/m  Patient's last menstrual period was 05/08/2020 (exact date).  General:   Alert,  Well-developed, well-nourished, pleasant and cooperative in NAD Head:  Normocephalic and atraumatic. Eyes:  Sclera clear, no icterus.  Both eyes are congested Ears:  Normal auditory acuity. Nose:  No deformity, discharge, or lesions. Mouth:  No deformity or lesions,oropharynx pink & moist. Neck:  Supple; no masses or thyromegaly. Lungs:  Respirations even and unlabored.  Clear throughout to auscultation.   No wheezes, crackles, or rhonchi. No acute distress. Heart:  Regular rate and rhythm; no murmurs, clicks, rubs, or gallops. Abdomen:  Normal bowel sounds. Soft, lower abdominal tenderness and non-distended without masses, hepatosplenomegaly or hernias noted.  No guarding or rebound tenderness.   Rectal: Not performed Msk:  Symmetrical without gross deformities. Good, equal movement & strength bilaterally. Pulses:  Normal pulses noted. Extremities:  No clubbing or edema.  No cyanosis. Neurologic:  Alert and oriented x3;  grossly normal neurologically. Skin:  Intact, maculopapular lesions on upper back, bilateral upper extremities, blanching on pressure, nontender nodules in bilateral lower legs, subtle malar rash no jaundice. Psych:  Alert and cooperative. Normal mood and affect.  Imaging Studies: CT abdomen pelvis is ordered  Assessment and Plan:   Laura Fuentes is a 40 y.o. Caucasian female with no significant past medical history is seen in consultation for 1  month history of abdominal pain, diarrhea with significantly elevated CRP, fecal calprotectin levels, positive ANA, and small joint arthralgia, skin rash as well as eye symptoms.  Differentials include IBD or autoimmune vasculitis like lupus Recommend consultation with infectious disease given positive Lyme's titers GI profile PCR to rule out infection including C. difficile Empirically start prednisone 40 mg daily Strongly advised her to follow-up with ophthalmology due to eye symptoms She should follow-up with PCP for referral to rheumatology to evaluate for autoimmune disease given positive ANA and extraintestinal manifestations Recommend follow-up with dermatology for skin biopsy Recommend upper endoscopy as well as colonoscopy for further evaluation  Follow up in 1 month   Cephas Darby, MD

## 2020-05-10 NOTE — Telephone Encounter (Signed)
Patient came by the office to pick up contrast to drink for her CT scan scheduled for tomorrow 4/22 for abdomen and pelvis area. Patient asked if CT lumbar spine can be added also due to her ongoing back pain and she states she is feeling some lumps in the lower back also. She said Dr Einar Pheasant is aware of this and this was discussed. I advised patient I would ask Dr Einar Pheasant and if she can add this scan I would need to authorize it and see if both scans can be done at the same time. Patient is aware I will let her know either way. Please review. She is scheduled in the morning for her original scan and I need to know today if this can be added. Thank you

## 2020-05-10 NOTE — Telephone Encounter (Signed)
Please advise patient that a lumbar CT scan is not the best way to view the back.   I would recommend she follow-up for back pain if persisting after she has started treatment with the GI specialist and we can discuss best imaging.   Typically would start with XR and get MRI.

## 2020-05-10 NOTE — Addendum Note (Signed)
Addended by: Lesleigh Noe on: 05/10/2020 04:49 PM   Modules accepted: Orders

## 2020-05-10 NOTE — Addendum Note (Signed)
Addended by: Lesleigh Noe on: 05/10/2020 04:08 PM   Modules accepted: Orders

## 2020-05-11 ENCOUNTER — Ambulatory Visit
Admission: RE | Admit: 2020-05-11 | Discharge: 2020-05-11 | Disposition: A | Discharge status: Home / Self Care | Payer: BC Managed Care – PPO | Source: Ambulatory Visit | Attending: Family Medicine | Admitting: Family Medicine

## 2020-05-11 DIAGNOSIS — K3189 Other diseases of stomach and duodenum: Secondary | ICD-10-CM | POA: Diagnosis not present

## 2020-05-11 DIAGNOSIS — R634 Abnormal weight loss: Secondary | ICD-10-CM

## 2020-05-11 DIAGNOSIS — K6389 Other specified diseases of intestine: Secondary | ICD-10-CM | POA: Diagnosis not present

## 2020-05-11 DIAGNOSIS — R1030 Lower abdominal pain, unspecified: Secondary | ICD-10-CM

## 2020-05-11 DIAGNOSIS — R112 Nausea with vomiting, unspecified: Secondary | ICD-10-CM

## 2020-05-11 DIAGNOSIS — H2000 Unspecified acute and subacute iridocyclitis: Secondary | ICD-10-CM | POA: Diagnosis not present

## 2020-05-11 DIAGNOSIS — R197 Diarrhea, unspecified: Secondary | ICD-10-CM

## 2020-05-11 LAB — CALPROTECTIN: Calprotectin: 786 mcg/g — ABNORMAL HIGH

## 2020-05-11 LAB — GIARDIA AND CRYPTOSPORIDIUM ANTIGEN PANEL
MICRO NUMBER:: 11766441
RESULT:: NOT DETECTED
SPECIMEN QUALITY:: ADEQUATE
Specimen Quality:: ADEQUATE
micro Number:: 11766440

## 2020-05-11 LAB — GASTROINTESTINAL PATHOGEN PANEL PCR

## 2020-05-11 MED ORDER — IOPAMIDOL (ISOVUE-300) INJECTION 61%
100.0000 mL | Freq: Once | INTRAVENOUS | Status: AC | PRN
  Administered 2020-05-11: 100 mL via INTRAVENOUS

## 2020-05-13 LAB — GI PROFILE, STOOL, PCR

## 2020-05-14 LAB — GASTROINTESTINAL PATHOGEN PANEL PCR
C. difficile Tox A/B, PCR: NOT DETECTED
Campylobacter, PCR: NOT DETECTED
Cryptosporidium, PCR: NOT DETECTED
E coli (ETEC) LT/ST PCR: NOT DETECTED
E coli (STEC) stx1/stx2, PCR: NOT DETECTED
E coli 0157, PCR: NOT DETECTED
Giardia lamblia, PCR: NOT DETECTED
Norovirus, PCR: NOT DETECTED
Rotavirus A, PCR: NOT DETECTED
Salmonella, PCR: NOT DETECTED
Shigella, PCR: NOT DETECTED

## 2020-05-14 MED ORDER — HYDROCODONE-ACETAMINOPHEN 5-325 MG PO TABS: 1.0000 | ORAL_TABLET | Freq: Three times a day (TID) | ORAL | 0 refills | Status: DC | PRN

## 2020-05-14 NOTE — Addendum Note (Signed)
Addended by: Lesleigh Noe on: 05/14/2020 08:54 AM   Modules accepted: Orders

## 2020-05-14 NOTE — Addendum Note (Signed)
Addended by: Lesleigh Noe on: 05/14/2020 08:12 AM   Modules accepted: Orders

## 2020-05-15 ENCOUNTER — Other Ambulatory Visit: Payer: Self-pay

## 2020-05-15 ENCOUNTER — Other Ambulatory Visit (INDEPENDENT_AMBULATORY_CARE_PROVIDER_SITE_OTHER): Payer: BC Managed Care – PPO

## 2020-05-15 DIAGNOSIS — R768 Other specified abnormal immunological findings in serum: Secondary | ICD-10-CM

## 2020-05-15 DIAGNOSIS — R7982 Elevated C-reactive protein (CRP): Secondary | ICD-10-CM | POA: Diagnosis not present

## 2020-05-15 DIAGNOSIS — M545 Low back pain, unspecified: Secondary | ICD-10-CM

## 2020-05-16 LAB — HLA-B27 ANTIGEN: HLA-B27 Antigen: NEGATIVE

## 2020-05-16 NOTE — Discharge Instructions (Signed)

## 2020-05-17 ENCOUNTER — Encounter: Payer: Self-pay | Admitting: Gastroenterology

## 2020-05-17 ENCOUNTER — Ambulatory Visit: Payer: BC Managed Care – PPO | Admitting: Anesthesiology

## 2020-05-17 ENCOUNTER — Other Ambulatory Visit: Payer: Self-pay

## 2020-05-17 ENCOUNTER — Encounter
Admission: RE | Disposition: A | Discharge status: Home / Self Care | Payer: Self-pay | Source: Ambulatory Visit | Attending: Gastroenterology

## 2020-05-17 ENCOUNTER — Other Ambulatory Visit: Payer: Self-pay | Admitting: Gastroenterology

## 2020-05-17 ENCOUNTER — Ambulatory Visit
Admission: RE | Admit: 2020-05-17 | Discharge: 2020-05-17 | Disposition: A | Discharge status: Home / Self Care | Payer: BC Managed Care – PPO | Source: Ambulatory Visit | Attending: Gastroenterology | Admitting: Gastroenterology

## 2020-05-17 DIAGNOSIS — Z8616 Personal history of COVID-19: Secondary | ICD-10-CM | POA: Diagnosis not present

## 2020-05-17 DIAGNOSIS — K6389 Other specified diseases of intestine: Secondary | ICD-10-CM | POA: Diagnosis not present

## 2020-05-17 DIAGNOSIS — K644 Residual hemorrhoidal skin tags: Secondary | ICD-10-CM | POA: Diagnosis not present

## 2020-05-17 DIAGNOSIS — Z85828 Personal history of other malignant neoplasm of skin: Secondary | ICD-10-CM | POA: Diagnosis not present

## 2020-05-17 DIAGNOSIS — K529 Noninfective gastroenteritis and colitis, unspecified: Secondary | ICD-10-CM | POA: Insufficient documentation

## 2020-05-17 DIAGNOSIS — R195 Other fecal abnormalities: Secondary | ICD-10-CM

## 2020-05-17 DIAGNOSIS — K5080 Crohn's disease of both small and large intestine without complications: Secondary | ICD-10-CM

## 2020-05-17 DIAGNOSIS — Z79899 Other long term (current) drug therapy: Secondary | ICD-10-CM | POA: Insufficient documentation

## 2020-05-17 DIAGNOSIS — Z7952 Long term (current) use of systemic steroids: Secondary | ICD-10-CM | POA: Insufficient documentation

## 2020-05-17 DIAGNOSIS — Z91018 Allergy to other foods: Secondary | ICD-10-CM | POA: Insufficient documentation

## 2020-05-17 DIAGNOSIS — K2100 Gastro-esophageal reflux disease with esophagitis, without bleeding: Secondary | ICD-10-CM | POA: Insufficient documentation

## 2020-05-17 DIAGNOSIS — Z87891 Personal history of nicotine dependence: Secondary | ICD-10-CM | POA: Diagnosis not present

## 2020-05-17 HISTORY — PX: COLONOSCOPY WITH PROPOFOL: SHX5780

## 2020-05-17 HISTORY — PX: BIOPSY: SHX5522

## 2020-05-17 HISTORY — PX: ESOPHAGOGASTRODUODENOSCOPY (EGD) WITH PROPOFOL: SHX5813

## 2020-05-17 LAB — POCT PREGNANCY, URINE: Preg Test, Ur: NEGATIVE

## 2020-05-17 SURGERY — COLONOSCOPY WITH PROPOFOL
Anesthesia: General | Site: Rectum

## 2020-05-17 MED ORDER — GLYCOPYRROLATE 0.2 MG/ML IJ SOLN
INTRAMUSCULAR | Status: DC | PRN
  Administered 2020-05-17 (×2): .1 mg via INTRAVENOUS

## 2020-05-17 MED ORDER — ONDANSETRON HCL 4 MG/2ML IJ SOLN
INTRAMUSCULAR | Status: DC | PRN
  Administered 2020-05-17: 4 mg via INTRAVENOUS

## 2020-05-17 MED ORDER — LIDOCAINE HCL (CARDIAC) PF 100 MG/5ML IV SOSY
PREFILLED_SYRINGE | INTRAVENOUS | Status: DC | PRN
  Administered 2020-05-17: 40 mg via INTRAVENOUS

## 2020-05-17 MED ORDER — LACTATED RINGERS IV SOLN: INTRAVENOUS | Status: DC

## 2020-05-17 MED ORDER — PROPOFOL 10 MG/ML IV BOLUS
INTRAVENOUS | Status: DC | PRN
  Administered 2020-05-17 (×5): 30 mg via INTRAVENOUS
  Administered 2020-05-17: 20 mg via INTRAVENOUS
  Administered 2020-05-17 (×4): 30 mg via INTRAVENOUS
  Administered 2020-05-17: 20 mg via INTRAVENOUS
  Administered 2020-05-17: 60 mg via INTRAVENOUS
  Administered 2020-05-17 (×2): 30 mg via INTRAVENOUS

## 2020-05-17 MED ORDER — SODIUM CHLORIDE 0.9 % IV SOLN
INTRAVENOUS | Status: DC
Start: 2020-05-17 — End: 2020-05-17

## 2020-05-17 MED ORDER — STERILE WATER FOR IRRIGATION IR SOLN
Status: DC | PRN
  Administered 2020-05-17: 100 mL

## 2020-05-17 SURGICAL SUPPLY — 19 items
BLOCK BITE 60FR ADLT L/F GRN (MISCELLANEOUS) ×3 IMPLANT
ELECT REM PT RETURN 9FT ADLT (ELECTROSURGICAL)
ELECTRODE REM PT RTRN 9FT ADLT (ELECTROSURGICAL) IMPLANT
FORCEPS BIOP RAD 4 LRG CAP 4 (CUTTING FORCEPS) ×3 IMPLANT
GOWN CVR UNV OPN BCK APRN NK (MISCELLANEOUS) ×4 IMPLANT
GOWN ISOL THUMB LOOP REG UNIV (MISCELLANEOUS) ×2
INJECTOR VARIJECT VIN23 (MISCELLANEOUS) IMPLANT
KIT PRC NS LF DISP ENDO (KITS) ×2 IMPLANT
KIT PROCEDURE OLYMPUS (KITS) ×1
MANIFOLD NEPTUNE II (INSTRUMENTS) ×3 IMPLANT
MARKER SPOT ENDO TATTOO 5ML (MISCELLANEOUS) IMPLANT
SNARE COLD EXACTO (MISCELLANEOUS) IMPLANT
SNARE SHORT THROW 13M SML OVAL (MISCELLANEOUS) IMPLANT
SNARE SHORT THROW 30M LRG OVAL (MISCELLANEOUS) IMPLANT
SNARE SNG USE RND 15MM (INSTRUMENTS) IMPLANT
SPOT EX ENDOSCOPIC TATTOO (MISCELLANEOUS)
TRAP ETRAP POLY (MISCELLANEOUS) IMPLANT
VARIJECT INJECTOR VIN23 (MISCELLANEOUS)
WATER STERILE IRR 250ML POUR (IV SOLUTION) ×3 IMPLANT

## 2020-05-17 NOTE — Anesthesia Postprocedure Evaluation (Signed)
Anesthesia Post Note  Patient: CHELSEI MCCHESNEY  Procedure(s) Performed: COLONOSCOPY WITH PROPOFOL (N/A ) ESOPHAGOGASTRODUODENOSCOPY (EGD) WITH PROPOFOL (N/A ) BIOPSY (N/A Rectum)     Patient location during evaluation: PACU Anesthesia Type: General Level of consciousness: awake and alert Pain management: pain level controlled Vital Signs Assessment: post-procedure vital signs reviewed and stable Respiratory status: spontaneous breathing, nonlabored ventilation and respiratory function stable Cardiovascular status: blood pressure returned to baseline and stable Postop Assessment: no apparent nausea or vomiting Anesthetic complications: no   No complications documented.  Wanda Plump Yeva Bissette

## 2020-05-17 NOTE — Anesthesia Procedure Notes (Signed)
Date/Time: 05/17/2020 9:42 AM Performed by: Dionne Bucy, CRNA Pre-anesthesia Checklist: Patient identified, Emergency Drugs available, Patient being monitored, Suction available and Timeout performed Patient Re-evaluated:Patient Re-evaluated prior to induction Oxygen Delivery Method: Nasal cannula Placement Confirmation: positive ETCO2

## 2020-05-17 NOTE — Transfer of Care (Signed)
Immediate Anesthesia Transfer of Care Note  Patient: Laura Fuentes  Procedure(s) Performed: COLONOSCOPY WITH PROPOFOL (N/A ) ESOPHAGOGASTRODUODENOSCOPY (EGD) WITH PROPOFOL (N/A ) BIOPSY (N/A Rectum)  Patient Location: PACU  Anesthesia Type: General  Level of Consciousness: awake, alert  and patient cooperative  Airway and Oxygen Therapy: Patient Spontanous Breathing and Patient connected to supplemental oxygen  Post-op Assessment: Post-op Vital signs reviewed, Patient's Cardiovascular Status Stable, Respiratory Function Stable, Patent Airway and No signs of Nausea or vomiting  Post-op Vital Signs: Reviewed and stable  Complications: No complications documented.

## 2020-05-17 NOTE — Anesthesia Preprocedure Evaluation (Signed)
Anesthesia Evaluation  Patient identified by MRN, date of birth, ID band Patient awake    Reviewed: NPO status   Airway Mallampati: II  TM Distance: >3 FB Neck ROM: Full    Dental   Pulmonary asthma , former smoker,    Pulmonary exam normal        Cardiovascular negative cardio ROS Normal cardiovascular exam     Neuro/Psych Anxiety    GI/Hepatic negative GI ROS,   Endo/Other    Renal/GU      Musculoskeletal   Abdominal   Peds  Hematology   Anesthesia Other Findings   Reproductive/Obstetrics                             Anesthesia Physical Anesthesia Plan  ASA: II  Anesthesia Plan: General   Post-op Pain Management:    Induction:   PONV Risk Score and Plan: 2 and Ondansetron  Airway Management Planned: Natural Airway  Additional Equipment:   Intra-op Plan:   Post-operative Plan:   Informed Consent: I have reviewed the patients History and Physical, chart, labs and discussed the procedure including the risks, benefits and alternatives for the proposed anesthesia with the patient or authorized representative who has indicated his/her understanding and acceptance.       Plan Discussed with: CRNA, Anesthesiologist and Surgeon  Anesthesia Plan Comments:         Anesthesia Quick Evaluation

## 2020-05-17 NOTE — Op Note (Signed)
[facility] Gastroenterology Patient Name: Laura Fuentes Procedure Date: 05/17/2020 9:37 AM MRN: [id number] Account #: [id number] Date of Birth: 01-Mar-1980 Admit Type: Outpatient Age: 40 Room: [facility] OR ROOM 01 Gender: Female Note Status: Finalized Procedure:             Upper GI endoscopy Indications:           Crohn's disease, Diarrhea Providers:             Lin Landsman MD, MD Referring MD:          Jobe Marker. Einar Pheasant (Referring MD) Medicines:             General Anesthesia Complications:         No immediate complications. Estimated blood loss: None. Procedure:             Pre-Anesthesia Assessment:                        - Prior to the procedure, a History and Physical was                         performed, and patient medications and allergies were                         reviewed. The patient is competent. The risks and                         benefits of the procedure and the sedation options and                         risks were discussed with the patient. All questions                         were answered and informed consent was obtained.                         Patient identification and proposed procedure were                         verified by the physician, the nurse, the                         anesthesiologist, the anesthetist and the technician                         in the pre-procedure area in the procedure room in the                         endoscopy suite. Mental Status Examination: alert and                         oriented. Airway Examination: normal oropharyngeal                         airway and neck mobility. Respiratory Examination:                         clear to auscultation. CV Examination: normal.  Prophylactic Antibiotics: The patient does not require                         prophylactic antibiotics. Prior Anticoagulants: The                         patient has taken no previous anticoagulant or                          antiplatelet agents. ASA Grade Assessment: II - A                         patient with mild systemic disease. After reviewing                         the risks and benefits, the patient was deemed in                         satisfactory condition to undergo the procedure. The                         anesthesia plan was to use general anesthesia.                         Immediately prior to administration of medications,                         the patient was re-assessed for adequacy to receive                         sedatives. The heart rate, respiratory rate, oxygen                         saturations, blood pressure, adequacy of pulmonary                         ventilation, and response to care were monitored                         throughout the procedure. The physical status of the                         patient was re-assessed after the procedure.                        After obtaining informed consent, the endoscope was                         passed under direct vision. Throughout the procedure,                         the patient's blood pressure, pulse, and oxygen                         saturations were monitored continuously. The was                         introduced through the mouth, and advanced to the  second part of duodenum. The upper GI endoscopy was                         accomplished without difficulty. The patient tolerated                         the procedure well. Findings:      The duodenal bulb, second portion of the duodenum and examined duodenum       were normal. Biopsies were taken with a cold forceps for histology.      The entire examined stomach was normal. Biopsies were taken with a cold       forceps for histology.      Esophagogastric landmarks were identified: the gastroesophageal junction       was found at 40 cm from the incisors.      The gastroesophageal junction was normal. Biopsies were taken with a        cold forceps for histology.      The examined esophagus was normal. Impression:            - Normal duodenal bulb, second portion of the duodenum                         and examined duodenum. Biopsied.                        - Normal stomach. Biopsied.                        - Esophagogastric landmarks identified.                        - Normal gastroesophageal junction. Biopsied.                        - Normal esophagus. Recommendation:        - Await pathology results.                        - Proceed with colonoscopy as scheduled                        See colonoscopy report Procedure Code(s):     --- Professional ---                        [phone], Esophagogastroduodenoscopy, flexible,                         transoral; with biopsy, single or multiple Diagnosis Code(s):     --- Professional ---                        K50.90, Crohn's disease, unspecified, without                         complications                        R19.7, Diarrhea, unspecified CPT copyright 2019 American Medical Association. All rights reserved. The codes documented in this report are preliminary and upon coder review may  be revised to meet current compliance requirements. Dr. Ulyess Mort Tally Due  Marius Ditch MD, MD 05/17/2020 9:57:47 AM This report has been signed electronically. Number of Addenda: 0 Note Initiated On: 05/17/2020 9:37 AM Total Procedure Duration: 0 hours 6 minutes 57 seconds  Estimated Blood Loss:  Estimated blood loss: none.      [facility]

## 2020-05-17 NOTE — H&P (Signed)
Laura Darby, MD [address]  [place]  [place], [place] [address]  Main: [phone]  Fax: [phone] Pager: [phone]  Primary Care Physician:  Lesleigh Noe, MD Primary Gastroenterologist:  Dr. Cephas Fuentes  Pre-Procedure History & Physical: HPI:  Laura Fuentes is a 40 y.o. female is here for an endoscopy and colonoscopy.   Past Medical History:  Diagnosis Date  . BCC (basal cell carcinoma of skin)   . COVID-19 01/2020  . History of asthma   . History of pre-eclampsia     Past Surgical History:  Procedure Laterality Date  . CESAREAN SECTION    . TONSILLECTOMY  2005    Prior to Admission medications   Medication Sig Start Date End Date Taking? Authorizing Provider  ALPRAZolam (XANAX) 1 MG tablet TAKE 1 TABLET BY MOUTH EVERY DAY AS NEEDED FOR SEVERE ANXIETY. MAX 20 TABS/ 30 DAYS 03/27/20  Yes [provider]  dicyclomine (BENTYL) 20 MG tablet Take 1 tablet (20 mg total) by mouth 2 (two) times daily. Patient taking differently: Take 20 mg by mouth as needed. 04/28/20  Yes Carlisle Cater, PA-C  ondansetron (ZOFRAN ODT) 4 MG disintegrating tablet Take 1 tablet (4 mg total) by mouth every 8 (eight) hours as needed for nausea or vomiting. 05/03/20  Yes Lesleigh Noe, MD  predniSONE (DELTASONE) 20 MG tablet Take 2 tablets (40 mg total) by mouth daily with breakfast. 05/10/20 06/09/20 Yes Yitzel Shasteen, Tally Due, MD  HYDROcodone-acetaminophen (NORCO/VICODIN) 5-325 MG tablet Take 1 tablet by mouth every 8 (eight) hours as needed for moderate pain or severe pain. 05/14/20   Lesleigh Noe, MD    Allergies as of 05/10/2020 - Review Complete 05/10/2020  Allergen Reaction Noted  . Citrus Rash 05/10/2020    Family History  Problem Relation Age of Onset  . Emphysema Paternal Grandmother   . Crohn's disease Paternal Grandmother   . Depression Mother   . Basal cell carcinoma Father   . Depression Sister   . Alzheimer's disease Maternal Grandmother   . Colon  cancer Maternal Grandmother 76  . Alcohol abuse Maternal Grandfather   . Suicidality Maternal Grandfather   . Colon cancer Paternal Grandfather 70  . Breast cancer Neg Hx     Social History   Socioeconomic History  . Marital status: Married    Spouse name: Laura Fuentes  . Number of children: 2  . Years of education: Bachelors  . Highest education level: Not on file  Occupational History  . Not on file  Tobacco Use  . Smoking status: Former Smoker    Packs/day: 0.25    Years: 5.00    Pack years: 1.25    Types: Cigarettes    Quit date: 01/20/2013    Years since quitting: 7.3  . Smokeless tobacco: Never Used  Vaping Use  . Vaping Use: Never used  Substance and Sexual Activity  . Alcohol use: Not Currently    Comment: 1-2 times a week, 1-2 glasses  . Drug use: Never  . Sexual activity: Yes    Birth control/protection: None  Other Topics Concern  . 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   Enjoys: gardening, hiking, playing with kids, travel    Exercise: elliptical, yard work, walks with her kids   Diet:  Whole foods, healthy   Social Determinants of Health  Financial Resource Strain: Not on file  Food Insecurity: Not on file  Transportation Needs: Not on file  Physical Activity: Not on file  Stress: Not on file  Social Connections: Not on file  Intimate Partner Violence: Not on file    Review of Systems: See HPI, otherwise negative ROS  Physical Exam: BP 113/79   Pulse 77   Temp (!) 97.3 F (36.3 C) (Temporal)   Resp 20   Ht 5' 9"  (1.753 m)   Wt 73.9 kg   LMP 05/08/2020 (Exact Date)   SpO2 99%   BMI 24.07 kg/m  General:   Alert,  pleasant and cooperative in NAD Head:  Normocephalic and atraumatic. Neck:  Supple; no masses or thyromegaly. Lungs:  Clear throughout to auscultation.    Heart:  Regular rate and rhythm. Abdomen:  Soft, nontender and nondistended. Normal  bowel sounds, without guarding, and without rebound.   Neurologic:  Alert and  oriented x4;  grossly normal neurologically.  Impression/Plan: Laura Fuentes is here for an endoscopy and colonoscopy to be performed for chronic diarrhea  Risks, benefits, limitations, and alternatives regarding  endoscopy and colonoscopy have been reviewed with the patient.  Questions have been answered.  All parties agreeable.   Sherri Sear, MD  05/17/2020, 9:09 AM

## 2020-05-17 NOTE — Op Note (Signed)
[facility] Gastroenterology Patient Name: Noella Kipnis Procedure Date: 05/17/2020 9:37 AM MRN: [id number] Account #: [id number] Date of Birth: July 12, 1980 Admit Type: Outpatient Age: 40 Room: [facility] OR ROOM 01 Gender: Female Note Status: Finalized Procedure:             Colonoscopy Indications:           This is the patient's first colonoscopy, Chronic                         diarrhea, Suspected Crohn's disease Providers:             Lin Landsman MD, MD Medicines:             General Anesthesia Complications:         No immediate complications. Estimated blood loss: None. Procedure:             Pre-Anesthesia Assessment:                        - Prior to the procedure, a History and Physical was                         performed, and patient medications and allergies were                         reviewed. The patient is competent. The risks and                         benefits of the procedure and the sedation options and                         risks were discussed with the patient. All questions                         were answered and informed consent was obtained.                         Patient identification and proposed procedure were                         verified by the physician, the nurse, the                         anesthesiologist, the anesthetist and the technician                         in the pre-procedure area in the procedure room in the                         endoscopy suite. Mental Status Examination: alert and                         oriented. Airway Examination: normal oropharyngeal                         airway and neck mobility. Respiratory Examination:                         clear to auscultation. CV Examination: normal.  Prophylactic Antibiotics: The patient does not require                         prophylactic antibiotics. Prior Anticoagulants: The                         patient has taken no previous  anticoagulant or                         antiplatelet agents. ASA Grade Assessment: II - A                         patient with mild systemic disease. After reviewing                         the risks and benefits, the patient was deemed in                         satisfactory condition to undergo the procedure. The                         anesthesia plan was to use general anesthesia.                         Immediately prior to administration of medications,                         the patient was re-assessed for adequacy to receive                         sedatives. The heart rate, respiratory rate, oxygen                         saturations, blood pressure, adequacy of pulmonary                         ventilation, and response to care were monitored                         throughout the procedure. The physical status of the                         patient was re-assessed after the procedure.                        After obtaining informed consent, the colonoscope was                         passed under direct vision. Throughout the procedure,                         the patient's blood pressure, pulse, and oxygen                         saturations were monitored continuously. The was                         introduced through the anus and advanced to the the  terminal ileum, with identification of the appendiceal                         orifice and IC valve. The colonoscopy was performed                         with moderate difficulty due to significant looping.                         Successful completion of the procedure was aided by                         applying abdominal pressure. The patient tolerated the                         procedure well. The quality of the bowel preparation                         was adequate. Findings:      Hemorrhoids were found on perianal exam.      The terminal ileum contained multiple patchy non-bleeding aphthae. No        stigmata of recent bleeding were seen. Biopsies were taken with a cold       forceps for histology.      The Simple Endoscopic Score for Crohn's Disease was determined based on       the endoscopic appearance of the mucosa in the following segments:      - Ileum: Findings include aphthous ulcers less than 0.5 cm in size, less       than 10% ulcerated surfaces, less than 50% of surfaces affected and no       narrowings. Segment score: 3.      - Right Colon: Findings include large ulcers 0.5 - 2 cm in size, 10-30%       ulcerated surfaces, less than 50% of surfaces affected and no       narrowings. Segment score: 5.      - Transverse Colon: Findings include aphthous ulcers less than 0.5 cm in       size, 10-30% ulcerated surfaces, less than 50% of surfaces affected and       no narrowings. Segment score: 4.      - Left Colon: Findings include ulcers greater than 2 cm in size, greater       than 30% ulcerated surfaces, 50-75% of surfaces affected and no       narrowings. Segment score: 8.      - Rectum: Findings include aphthous ulcers less than 0.5 cm in size,       less than 10% ulcerated surfaces, less than 50% of surfaces affected and       no narrowings. Segment score: 3.      - Total SES-CD aggregate score: 23. Biopsies were taken with a cold       forceps for histology. Estimated blood loss was minimal. Estimated blood       loss was minimal.      Non-bleeding external hemorrhoids were found during retroflexion. The       hemorrhoids were medium-sized. Impression:            - Hemorrhoids found on perianal exam.                        -  Aphtha in the terminal ileum. Biopsied.                        - Simple Endoscopic Score for Crohn's Disease: 23,                         mucosal inflammatory changes secondary to Crohn's                         disease, with ileitis and colitis. Biopsied.                        - Non-bleeding external hemorrhoids. Recommendation:        -  Discharge patient to home (with escort).                        - Resume previous diet today.                        - Continue present medications.                        - Await pathology results.                        - Return to my office as previously scheduled. Procedure Code(s):     --- Professional ---                        [phone], Colonoscopy, flexible; with biopsy, single or                         multiple Diagnosis Code(s):     --- Professional ---                        K64.4, Residual hemorrhoidal skin tags                        K63.89, Other specified diseases of intestine                        K50.80, Crohn's disease of both small and large                         intestine without complications                        K52.9, Noninfective gastroenteritis and colitis,                         unspecified CPT copyright 2019 American Medical Association. All rights reserved. The codes documented in this report are preliminary and upon coder review may  be revised to meet current compliance requirements. Dr. Ulyess Mort Lin Landsman MD, MD 05/17/2020 10:42:07 AM This report has been signed electronically. Number of Addenda: 0 Note Initiated On: 05/17/2020 9:37 AM Scope Withdrawal Time: 0 hours 18 minutes 24 seconds  Total Procedure Duration: 0 hours 28 minutes 8 seconds  Estimated Blood Loss:  Estimated blood loss: none.      [facility]

## 2020-05-18 ENCOUNTER — Encounter: Payer: Self-pay | Admitting: Gastroenterology

## 2020-05-18 DIAGNOSIS — K5080 Crohn's disease of both small and large intestine without complications: Secondary | ICD-10-CM | POA: Diagnosis not present

## 2020-05-19 LAB — HEPATITIS B SURFACE ANTIGEN: Hepatitis B Surface Ag: NEGATIVE

## 2020-05-19 LAB — HEPATITIS B CORE ANTIBODY, TOTAL: Hep B Core Total Ab: NEGATIVE

## 2020-05-19 LAB — HEPATITIS A ANTIBODY, TOTAL: hep A Total Ab: NEGATIVE

## 2020-05-19 LAB — HEPATITIS B SURFACE ANTIBODY,QUALITATIVE: Hep B Surface Ab, Qual: NONREACTIVE

## 2020-05-20 LAB — HCV RNA QUANT RFLX ULTRA OR GENOTYP: HCV Quant Baseline: NOT DETECTED IU/mL

## 2020-05-21 LAB — SURGICAL PATHOLOGY

## 2020-05-21 NOTE — Telephone Encounter (Signed)
Made appointment

## 2020-05-22 LAB — QUANTIFERON-TB GOLD PLUS

## 2020-05-23 ENCOUNTER — Telehealth (INDEPENDENT_AMBULATORY_CARE_PROVIDER_SITE_OTHER): Payer: BC Managed Care – PPO | Admitting: Gastroenterology

## 2020-05-23 DIAGNOSIS — K5080 Crohn's disease of both small and large intestine without complications: Secondary | ICD-10-CM | POA: Diagnosis not present

## 2020-05-23 DIAGNOSIS — R195 Other fecal abnormalities: Secondary | ICD-10-CM

## 2020-05-23 LAB — THIOPURINE METHYLTRANSFERASE (TPMT), RBC: TPMT Activity:: 7.1 Units/mL RBC

## 2020-05-23 NOTE — Progress Notes (Signed)
Laura Sear, MD [address]  [place]  [place], [place] [address]  Main: [phone]  Fax: [phone]    Gastroenterology Consultation Tele Visit  Referring Provider:     Lesleigh Noe, MD Primary Care Physician:  Lesleigh Noe, MD Primary Gastroenterologist:  Dr. Cephas Darby Reason for Consultation:     Crohn's colitis        HPI:   Laura Fuentes is a 40 y.o. female referred by Dr. Einar Pheasant, Jobe Marker, MD  for consultation & management of Crohn's colitis.    Virtual Visit via Telephone Note  I connected with Laura Fuentes on 05/23/20 at 10:00 AM EDT by telephone and verified that I am speaking with the correct person using two identifiers.   I discussed the limitations, risks, security and privacy concerns of performing an evaluation and management service by telephone and the availability of in person appointments. I also discussed with the patient that there may be a patient responsible charge related to this service. The patient expressed understanding and agreed to proceed.  Location of the Patient: Home  Location of the provider: Office  Persons participating in the visit: Patient and provider only  History of Present Illness: Patient underwent upper endoscopy and colonoscopy.  Upper endoscopy was unremarkable.  Colonoscopy confirmed moderately active Crohn's colitis.  Patient is currently on prednisone 40 mg daily.  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  She has seen ophthalmologist, currently on steroid eyedrops.  Her visual symptoms are improving.  She also reports that her GI symptoms are improving, bowels are more formed, with no rectal bleeding, with minimal abdominal cramps.  Her appetite has also returned.  Patient wanted to discuss alternative therapies as she is not ready to commit to long-term treatment with biologic therapy at this time.  She wanted to know the role of  diet as well as antibiotics for management of Crohn's disease.  I have informed patient that elemental diet has been proven to the pediatric Crohn's disease.  Clinical trials are currently very distantly the role of diet at this time.  Antibiotics have been limited only in, they are indicated in the presence of abscesses, fistulas.  Antibiotics alone do not help to control inflammation related to Crohn's disease.  I also explained to patient that she will benefit from Biologics such as anti-TNF therapy or Stelara.  Given her extraintestinal manifestations, I do not recommend Entyvio in her case.  I have discussed the risks and benefits, efficacy of biologic therapy and long-term commitment if she is able to tolerate them.  Advised her that she can try lactose-free diet due to presence of colitis that may help reduce the severity of symptoms.  Patient is not ready to start biologic therapy and commit to long-term because she is worried about the side effects.  She thinks stress triggered the inflammation and her last 1 to 2 weeks she is trying to relax, has been less stressful, watching her diet and she is hoping that these will help resolve her symptoms.  Tried to explain to her that however it will not help with inflammation    NSAIDs: none  Antiplts/Anticoagulants/Anti thrombotics: None  GI Procedures:  EGD and colonoscopy 05/17/2020 - Normal duodenal bulb, second portion of the duodenum and examined duodenum. Biopsied. - Normal stomach. Biopsied. - Esophagogastric landmarks identified. - Normal gastroesophageal junction. Biopsied. - Normal esophagus.  - Hemorrhoids found on perianal exam. -  Aphtha in the terminal ileum. Biopsied. - Simple Endoscopic Score for Crohn's Disease: 23, mucosal inflammatory changes secondary to Crohn's disease, with ileitis and colitis. Biopsied. - Non-bleeding external hemorrhoids.  DIAGNOSIS:  A. DUODENUM; COLD BIOPSY:  - DUODENAL MUCOSA WITH NO SIGNIFICANT  PATHOLOGIC ALTERATION.  - NEGATIVE FOR FEATURES OF CELIAC DISEASE.  - NEGATIVE FOR DYSPLASIA AND MALIGNANCY.   B. STOMACH, RANDOM; COLD BIOPSY:  - ANTRAL AND OXYNTIC MUCOSA WITH NO SIGNIFICANT PATHOLOGIC ALTERATION.  - NEGATIVE FOR ACTIVE INFLAMMATION AND H PYLORI.  - NEGATIVE FOR INTESTINAL METAPLASIA, DYSPLASIA, AND MALIGNANCY.   C. GEJ; COLD BIOPSY:  - REFLUX GASTROESOPHAGITIS.  - NEGATIVE FOR INTESTINAL METAPLASIA, DYSPLASIA, AND MALIGNANCY.   D. TERMINAL ILEUM; COLD BIOPSY:  - ILEAL MUCOSA WITH NO SIGNIFICANT PATHOLOGIC ALTERATION.  - NEGATIVE FOR ACTIVE INFLAMMATION AND FEATURES OF CHRONICITY.  - NEGATIVE FOR DYSPLASIA AND MALIGNANCY.   E. COLON, RIGHT; COLD BIOPSY:  - CHRONIC COLITIS WITH PATCHY MODERATE ACTIVITY, COMPATIBLE WITH THE  REPORTED HISTORY OF CROHN'S DISEASE.  - NEGATIVE FOR DYSPLASIA AND MALIGNANCY.   F. COLON, LEFT; COLD BIOPSY:  - CHRONIC COLITIS WITH PATCHY MODERATE ACTIVITY, COMPATIBLE WITH THE  REPORTED HISTORY OF CROHN'S DISEASE.  - NEGATIVE FOR DYSPLASIA AND MALIGNANCY.   G. RECTUM; COLD BIOPSY:  - CHRONIC PROCTITIS WITH PATCHY MILD ACTIVITY, COMPATIBLE WITH THE  REPORTED HISTORY OF CROHN'S DISEASE.  - NEGATIVE FOR DYSPLASIA AND MALIGNANCY.  Past Medical History:  Diagnosis Date  . BCC (basal cell carcinoma of skin)   . COVID-19 01/2020  . History of asthma   . History of pre-eclampsia     Past Surgical History:  Procedure Laterality Date  . BIOPSY N/A 05/17/2020   Procedure: BIOPSY;  Surgeon: Laura Landsman, MD;  Location: [facility];  Service: Endoscopy;  Laterality: N/A;  . CESAREAN SECTION    . COLONOSCOPY WITH PROPOFOL N/A 05/17/2020   Procedure: COLONOSCOPY WITH PROPOFOL;  Surgeon: Laura Landsman, MD;  Location: [facility];  Service: Endoscopy;  Laterality: N/A;  . ESOPHAGOGASTRODUODENOSCOPY (EGD) WITH PROPOFOL N/A 05/17/2020   Procedure: ESOPHAGOGASTRODUODENOSCOPY (EGD) WITH PROPOFOL;  Surgeon: Laura Landsman, MD;  Location: [facility];  Service: Endoscopy;  Laterality: N/A;  . TONSILLECTOMY  2005    Current Outpatient Medications:  .  ALPRAZolam (XANAX) 1 MG tablet, TAKE 1 TABLET BY MOUTH EVERY DAY AS NEEDED FOR SEVERE ANXIETY. MAX 20 TABS/ 30 DAYS, Disp: , Rfl:  .  dicyclomine (BENTYL) 20 MG tablet, Take 1 tablet (20 mg total) by mouth 2 (two) times daily. (Patient taking differently: Take 20 mg by mouth as needed.), Disp: 30 tablet, Rfl: 0 .  HYDROcodone-acetaminophen (NORCO/VICODIN) 5-325 MG tablet, Take 1 tablet by mouth every 8 (eight) hours as needed for moderate pain or severe pain., Disp: 30 tablet, Rfl: 0 .  ondansetron (ZOFRAN ODT) 4 MG disintegrating tablet, Take 1 tablet (4 mg total) by mouth every 8 (eight) hours as needed for nausea or vomiting., Disp: 20 tablet, Rfl: 0 .  predniSONE (DELTASONE) 20 MG tablet, Take 2 tablets (40 mg total) by mouth daily with breakfast., Disp: 60 tablet, Rfl: 0   Family History  Problem Relation Age of Onset  . Emphysema Paternal Grandmother   . Crohn's disease Paternal Grandmother   . Depression Mother   . Basal cell carcinoma Father   . Depression Sister   . Alzheimer's disease Maternal Grandmother   . Colon cancer Maternal Grandmother 35  . Alcohol abuse Maternal Grandfather   .  Suicidality Maternal Grandfather   . Colon cancer Paternal Grandfather 23  . Breast cancer Neg Hx      Social History   Tobacco Use  . Smoking status: Former Smoker    Packs/day: 0.25    Years: 5.00    Pack years: 1.25    Types: Cigarettes    Quit date: 01/20/2013    Years since quitting: 7.3  . Smokeless tobacco: Never Used  Vaping Use  . Vaping Use: Never used  Substance Use Topics  . Alcohol use: Not Currently    Comment: 1-2 times a week, 1-2 glasses  . Drug use: Never    Allergies as of 05/23/2020 - Review Complete 05/17/2020  Allergen Reaction Noted  . Citrus Rash 05/10/2020     Imaging Studies: Reviewed  Assessment and  Plan:   Laura Fuentes is a 40 y.o. pleasant Caucasian female with no significant past medical history with new diagnosis of moderately severe Crohn's colitis, opiate use, inflammatory arthritis, erythema nodosum.  Patient is currently on short course of prednisone, taking 40 mg daily, finishing 2 weeks, advised her to start taper by decreasing the dose by 10 mg every week until finished Patient said she will think about biologic therapy and let me know  Follow Up Instructions:   I discussed the assessment and treatment plan with the patient. The patient was provided an opportunity to ask questions and all were answered. The patient agreed with the plan and demonstrated an understanding of the instructions.   The patient was advised to call back or seek an in-person evaluation if the symptoms worsen or if the condition fails to improve as anticipated.  I provided 20 minutes of non-face-to-face time during this encounter.   Follow up as needed   Cephas Darby, MD

## 2020-05-24 ENCOUNTER — Ambulatory Visit: Payer: BC Managed Care – PPO | Admitting: Gastroenterology

## 2020-05-24 DIAGNOSIS — H2000 Unspecified acute and subacute iridocyclitis: Secondary | ICD-10-CM | POA: Diagnosis not present

## 2020-05-24 DIAGNOSIS — H4710 Unspecified papilledema: Secondary | ICD-10-CM | POA: Diagnosis not present

## 2020-06-04 ENCOUNTER — Other Ambulatory Visit: Payer: Self-pay | Admitting: Ophthalmology

## 2020-06-04 DIAGNOSIS — H4710 Unspecified papilledema: Secondary | ICD-10-CM

## 2020-06-05 MED ORDER — HYDROCODONE-ACETAMINOPHEN 5-325 MG PO TABS: 1.0000 | ORAL_TABLET | Freq: Three times a day (TID) | ORAL | 0 refills | Status: DC | PRN

## 2020-06-05 NOTE — Addendum Note (Signed)
Addended by: Lesleigh Noe on: 06/05/2020 08:11 AM   Modules accepted: Orders

## 2020-06-07 ENCOUNTER — Ambulatory Visit: Payer: BC Managed Care – PPO | Admitting: Gastroenterology

## 2020-06-10 ENCOUNTER — Ambulatory Visit: Payer: BC Managed Care – PPO

## 2020-06-11 ENCOUNTER — Other Ambulatory Visit: Payer: Self-pay

## 2020-06-11 ENCOUNTER — Ambulatory Visit (INDEPENDENT_AMBULATORY_CARE_PROVIDER_SITE_OTHER)
Admission: RE | Admit: 2020-06-11 | Discharge: 2020-06-11 | Disposition: A | Discharge status: Home / Self Care | Payer: BC Managed Care – PPO | Source: Ambulatory Visit | Attending: Family Medicine | Admitting: Family Medicine

## 2020-06-11 ENCOUNTER — Ambulatory Visit (INDEPENDENT_AMBULATORY_CARE_PROVIDER_SITE_OTHER): Payer: BC Managed Care – PPO | Admitting: Family Medicine

## 2020-06-11 ENCOUNTER — Encounter: Payer: Self-pay | Admitting: Family Medicine

## 2020-06-11 VITALS — BP 112/74 | HR 61 | Temp 97.6°F | Ht 69.0 in | Wt 159.0 lb

## 2020-06-11 DIAGNOSIS — M25561 Pain in right knee: Secondary | ICD-10-CM

## 2020-06-11 DIAGNOSIS — M25461 Effusion, right knee: Secondary | ICD-10-CM

## 2020-06-11 NOTE — Progress Notes (Signed)
Laura Fuentes T. Ardell Aaronson, MD, [place] at [facility] [place], [address]  Phone: [phone]  FAX: [phone]  Laura Fuentes - 40 y.o. female  MRN [id number]  Date of Birth: [birth year]  Date: 06/11/2020  PCP: Lesleigh Noe, MD  Referral: Lesleigh Noe, MD  Chief Complaint  Patient presents with  . Joint Swelling    Right   . Knee Pain    This visit occurred during the SARS-CoV-2 public health emergency.  Safety protocols were in place, including screening questions prior to the visit, additional usage of staff PPE, and extensive cleaning of exam room while observing appropriate contact time as indicated for disinfecting solutions.   Subjective:   Laura Fuentes is a 40 y.o. very pleasant female patient with Body mass index is 23.48 kg/m. who presents with the following:  Pain and swelling of the right knee: She does not recall any specific injury, but she was gardening the day before it started to hurt after squatting quite a bit.  On the day of the gardening she did not have any pain.  Right now she does have a significant effusion.  She also was recently diagnosed with Crohn's disease.  In the past, she has not had any significant right knee injuries, no fractures, no surgeries.  She did go to the water park with her 52-year-old children over the weekend, it did hurt after doing this.  Twisting and rotational movements cause the knee to have pain.  She at times feels as if she is subjectively going to give way at the knee.  No locking up of the joint or other mechanical symptoms.  No injury  Review of Systems is noted in the HPI, as appropriate   Objective:   BP 112/74   Pulse 61   Temp 97.6 F (36.4 C) (Temporal)   Ht 5' 9"  (1.753 m)   Wt 159 lb (72.1 kg)   LMP 06/11/2020   SpO2 98%   BMI 23.48 kg/m    Knee: Right Gait: Normal heel toe pattern, mild  antalgia ROM: She lacks 6 degrees of extension and flexion to 120. Effusion: Mild Echymosis or edema: none Patellar tendon NT Painful PLICA: neg Patellar grind: negative Medial and lateral patellar facet loading: negative medial and lateral joint lines: Minimal Mcmurray's positive for pain Flexion-pinch positive for pain Varus and valgus stress: stable Lachman: neg Ant and Post drawer: neg Hip abduction, IR, ER: WNL Hip flexion str: 5/5 Hip abd: 5/5 Quad: 5/5 VMO atrophy:No Hamstring concentric and eccentric: 5/5   Radiology: No results found.  Assessment and Plan:     ICD-10-CM   1. Acute pain of right knee  M25.561 DG Knee 4 Views W/Patella Right  2. Effusion of right knee joint  M25.461 DG Knee 4 Views W/Patella Right   X-rays: AP Bilateral Weight-bearing, Weightbearing Lateral, Sunrise views, Lutricia Feil view Indication: knee pain, right Findings: Joint spaces are preserved.  She does have a lateral tilt to the patella on her sunrise view.  No osteophyte formation.  Small effusion is visualized on the lateral projection. Electronically Signed  By: Owens Loffler, MD On: 06/11/2020 11:20 AM EDT   Acute knee pain over the last couple of weeks, escalated over the weekend.  She does have an effusion without any warmth.  She does have some pain with terminal extension and flexion.  No pain at the joint lines.  With a clinical history of squatting quite deeply in the garden, is most likely suspect small meniscal tear.  Inflammatory bowel disease can be associated with arthropathy and monoarticular involvement.  I think that this will be managed initially the same regardless of the inciting pathology.  Scheduled low-dose NSAIDs if her stomach can tolerate it, 4 times a day Voltaren gel. Work on range of motion, hopefully will help mobilize the effusion.  She also does have a upcoming appointment with rheumatology as well.  That she is still not doing well in 3 weeks, then  coming into the office and have me aspirate and inject her knee would be a reasonable course of action.  Patient Instructions  Ibuprofen 2 over the counter tablets three times a day  Voltaren 1% gel. Over the counter You can apply up to 4 times a day Minimal is absorbed in the bloodstream Cost is about 9 dollars     Orders Placed This Encounter  Procedures  . DG Knee 4 Views W/Patella Right   Signed,  Frederico Hamman T. Shakeila Pfarr, MD   Outpatient Encounter Medications as of 06/11/2020  Medication Sig  . ALPRAZolam (XANAX) 1 MG tablet   . HYDROcodone-acetaminophen (NORCO/VICODIN) 5-325 MG tablet Take 1 tablet by mouth every 8 (eight) hours as needed for moderate pain or severe pain.  . [DISCONTINUED] atropine 1 % ophthalmic solution 1 drop 2 (two) times daily.  . [DISCONTINUED] Difluprednate 0.05 % EMUL INSTILL 1 DROP INTO BOTH EYES 4 TIMES A DAY   No facility-administered encounter medications on file as of 06/11/2020.

## 2020-06-11 NOTE — Patient Instructions (Signed)
Ibuprofen 2 over the counter tablets three times a day  Voltaren 1% gel. Over the counter You can apply up to 4 times a day Minimal is absorbed in the bloodstream Cost is about 9 dollars

## 2020-06-12 DIAGNOSIS — F4322 Adjustment disorder with anxiety: Secondary | ICD-10-CM | POA: Diagnosis not present

## 2020-06-19 DIAGNOSIS — F4322 Adjustment disorder with anxiety: Secondary | ICD-10-CM | POA: Diagnosis not present

## 2020-06-21 ENCOUNTER — Encounter: Payer: Self-pay | Admitting: Family Medicine

## 2020-06-21 ENCOUNTER — Ambulatory Visit (INDEPENDENT_AMBULATORY_CARE_PROVIDER_SITE_OTHER): Payer: BC Managed Care – PPO | Admitting: Family Medicine

## 2020-06-21 ENCOUNTER — Other Ambulatory Visit: Payer: Self-pay

## 2020-06-21 ENCOUNTER — Ambulatory Visit (INDEPENDENT_AMBULATORY_CARE_PROVIDER_SITE_OTHER)
Admission: RE | Admit: 2020-06-21 | Discharge: 2020-06-21 | Disposition: A | Discharge status: Home / Self Care | Payer: BC Managed Care – PPO | Source: Ambulatory Visit | Attending: Family Medicine | Admitting: Family Medicine

## 2020-06-21 VITALS — BP 118/62 | HR 76 | Temp 98.1°F | Ht 69.0 in | Wt 161.0 lb

## 2020-06-21 DIAGNOSIS — H4710 Unspecified papilledema: Secondary | ICD-10-CM | POA: Diagnosis not present

## 2020-06-21 DIAGNOSIS — M2550 Pain in unspecified joint: Secondary | ICD-10-CM

## 2020-06-21 DIAGNOSIS — R109 Unspecified abdominal pain: Secondary | ICD-10-CM

## 2020-06-21 DIAGNOSIS — G8929 Other chronic pain: Secondary | ICD-10-CM | POA: Diagnosis not present

## 2020-06-21 DIAGNOSIS — Z1159 Encounter for screening for other viral diseases: Secondary | ICD-10-CM

## 2020-06-21 DIAGNOSIS — M533 Sacrococcygeal disorders, not elsewhere classified: Secondary | ICD-10-CM

## 2020-06-21 DIAGNOSIS — L509 Urticaria, unspecified: Secondary | ICD-10-CM

## 2020-06-21 DIAGNOSIS — K5090 Crohn's disease, unspecified, without complications: Secondary | ICD-10-CM

## 2020-06-21 NOTE — Assessment & Plan Note (Signed)
Pain improving, reassuring exam. Cont exercises. XR without fracture and joint spaces appear normal. Will f/u final read.

## 2020-06-21 NOTE — Assessment & Plan Note (Signed)
?  food related. Will get food allergy testing as pt with worsening symptoms after adding back potential allergens to diet. Discussed that allergy testing is subjective, but if positive could provide some insight to reduce overall inflammation in individual with crohn's disease.

## 2020-06-21 NOTE — Patient Instructions (Signed)
#   allergy testing - will see if this is helpful   #x-ray today  Will follow-up with results

## 2020-06-21 NOTE — Assessment & Plan Note (Signed)
Improving after steroids and with dietary changes. Following with GI. Appreciate their support

## 2020-06-21 NOTE — Progress Notes (Signed)
Subjective:     Laura Fuentes is a 40 y.o. female presenting for Back Pain (SI joint and knee pain as improved some this week. ) and Urticaria (On elbow and knees started Monday. Would like to see if she can get testing for food allergies. )     HPI  #Back pain - was in severe pain all weekend - bad on Monday - took some pain medication w/ improvement - back pain has improved over the last few days - SI joints - doing some home PT and have improvement - knee was worse this weekend - does not think the pain was related to the diet changes  #Hives - on elbows and knees - had been expending diet and vodka, gluten, dairy and not sure if this triggered the rash - painful initially - not itchy - has had similar rashes on the elbows in the past - last time was [place]    Review of Systems   Social History   Tobacco Use  Smoking Status Former Smoker  . Packs/day: 0.25  . Years: 5.00  . Pack years: 1.25  . Types: Cigarettes  . Quit date: 01/20/2013  . Years since quitting: 7.4  Smokeless Tobacco Never Used        Objective:    BP Readings from Last 3 Encounters:  06/21/20 118/62  06/11/20 112/74  05/17/20 124/89   Wt Readings from Last 3 Encounters:  06/21/20 161 lb (73 kg)  06/11/20 159 lb (72.1 kg)  05/17/20 163 lb (73.9 kg)    BP 118/62   Pulse 76   Temp 98.1 F (36.7 C) (Temporal)   Ht 5' 9"  (1.753 m)   Wt 161 lb (73 kg)   LMP 06/11/2020   SpO2 98%   BMI 23.78 kg/m    Physical Exam Constitutional:      General: She is not in acute distress.    Appearance: She is well-developed. She is not diaphoretic.  HENT:     Right Ear: External ear normal.     Left Ear: External ear normal.     Nose: Nose normal.  Eyes:     Conjunctiva/sclera: Conjunctivae normal.  Cardiovascular:     Rate and Rhythm: Normal rate and regular rhythm.  Pulmonary:     Effort: Pulmonary effort is normal. No respiratory distress.     Breath sounds: Normal breath  sounds. No wheezing.  Musculoskeletal:     Cervical back: Neck supple.     Comments: Muscle tension/knots and ttp along the SI joint No bony ttp  Skin:    General: Skin is warm and dry.     Capillary Refill: Capillary refill takes less than 2 seconds.     Comments: Erythematous papular rash on bilateral extensor surface of the elbows  Neurological:     Mental Status: She is alert. Mental status is at baseline.  Psychiatric:        Mood and Affect: Mood normal.        Behavior: Behavior normal.     XR SI Joints: on my review no sign of fracture or joint space narrowing      Assessment & Plan:   Problem List Items Addressed This Visit      Digestive   Crohn's disease ([place])    Improving after steroids and with dietary changes. Following with GI. Appreciate their support        Musculoskeletal and Integument   Hives - Primary    ?food  related. Will get food allergy testing as pt with worsening symptoms after adding back potential allergens to diet. Discussed that allergy testing is subjective, but if positive could provide some insight to reduce overall inflammation in individual with crohn's disease.       Relevant Orders   Food Prof w/Component Rflx IgE   Celiac Pnl 2 rflx Endomysial Ab Ttr     Other   Polyarthralgia    Will check allergy markers to see if these could be triggering symptoms. Also discussed we could consider treating for lyme disease or ID referral for further consideration. Was 1 test below the threshold for diagnoses of chronic lyme. Has rheum appointment. She will consider.       Chronic SI joint pain    Pain improving, reassuring exam. Cont exercises. XR without fracture and joint spaces appear normal. Will f/u final read.       Relevant Orders   DG Si Joints    Other Visit Diagnoses    Abdominal discomfort       Relevant Orders   Food Prof w/Component Rflx IgE   Celiac Pnl 2 rflx Endomysial Ab Ttr   Chronic joint pain       Relevant Orders    Food Prof w/Component Rflx IgE   Celiac Pnl 2 rflx Endomysial Ab Ttr   Need for hepatitis C screening test       Relevant Orders   Hepatitis C antibody     I spent >25 minutes with pt , obtaining history, examining, reviewing chart, documenting encounter and discussing the above plan of care.    Return if symptoms worsen or fail to improve.  Lesleigh Noe, MD  This visit occurred during the SARS-CoV-2 public health emergency.  Safety protocols were in place, including screening questions prior to the visit, additional usage of staff PPE, and extensive cleaning of exam room while observing appropriate contact time as indicated for disinfecting solutions.

## 2020-06-21 NOTE — Assessment & Plan Note (Signed)
Will check allergy markers to see if these could be triggering symptoms. Also discussed we could consider treating for lyme disease or ID referral for further consideration. Was 1 test below the threshold for diagnoses of chronic lyme. Has rheum appointment. She will consider.

## 2020-06-22 ENCOUNTER — Encounter: Payer: Self-pay | Admitting: Ophthalmology

## 2020-06-22 DIAGNOSIS — H4389 Other disorders of vitreous body: Secondary | ICD-10-CM | POA: Diagnosis not present

## 2020-06-22 DIAGNOSIS — R9089 Other abnormal findings on diagnostic imaging of central nervous system: Secondary | ICD-10-CM | POA: Diagnosis not present

## 2020-06-22 DIAGNOSIS — H538 Other visual disturbances: Secondary | ICD-10-CM | POA: Diagnosis not present

## 2020-06-22 DIAGNOSIS — H4710 Unspecified papilledema: Secondary | ICD-10-CM | POA: Diagnosis not present

## 2020-06-25 ENCOUNTER — Telehealth: Payer: Self-pay | Admitting: Family Medicine

## 2020-06-25 ENCOUNTER — Ambulatory Visit: Payer: BC Managed Care – PPO

## 2020-06-25 DIAGNOSIS — M9903 Segmental and somatic dysfunction of lumbar region: Secondary | ICD-10-CM | POA: Diagnosis not present

## 2020-06-25 DIAGNOSIS — M9904 Segmental and somatic dysfunction of sacral region: Secondary | ICD-10-CM | POA: Diagnosis not present

## 2020-06-25 DIAGNOSIS — M461 Sacroiliitis, not elsewhere classified: Secondary | ICD-10-CM | POA: Diagnosis not present

## 2020-06-25 DIAGNOSIS — M5451 Vertebrogenic low back pain: Secondary | ICD-10-CM | POA: Diagnosis not present

## 2020-06-25 NOTE — Telephone Encounter (Signed)
Returned phone call to Dr. Rachell Cipro office but he was unable to speak.   Advised to nurse that he could send staff message to communicate.  Reviewed recent Mychart message about concern for lyme's disease and need for additional labs.   Will await his message

## 2020-06-27 LAB — CELIAC PNL 2 RFLX ENDOMYSIAL AB TTR
(tTG) Ab, IgA: <1 U/mL
(tTG) Ab, IgG: <1 U/mL
Endomysial Ab IgA: NEGATIVE
Gliadin IgA: <1 U/mL
Gliadin IgG: <1 U/mL
Immunoglobulin A: 387 mg/dL — ABNORMAL HIGH (ref 47–310)

## 2020-06-27 LAB — HEPATITIS C ANTIBODY
Hepatitis C Ab: NONREACTIVE
SIGNAL TO CUT-OFF: 0.04 (ref <1.00)

## 2020-06-28 DIAGNOSIS — H4710 Unspecified papilledema: Secondary | ICD-10-CM | POA: Diagnosis not present

## 2020-06-28 DIAGNOSIS — M9904 Segmental and somatic dysfunction of sacral region: Secondary | ICD-10-CM | POA: Diagnosis not present

## 2020-06-28 DIAGNOSIS — H2000 Unspecified acute and subacute iridocyclitis: Secondary | ICD-10-CM | POA: Diagnosis not present

## 2020-06-28 DIAGNOSIS — F4322 Adjustment disorder with anxiety: Secondary | ICD-10-CM | POA: Diagnosis not present

## 2020-06-28 DIAGNOSIS — M461 Sacroiliitis, not elsewhere classified: Secondary | ICD-10-CM | POA: Diagnosis not present

## 2020-06-28 DIAGNOSIS — H209 Unspecified iridocyclitis: Secondary | ICD-10-CM | POA: Diagnosis not present

## 2020-06-28 DIAGNOSIS — M5451 Vertebrogenic low back pain: Secondary | ICD-10-CM | POA: Diagnosis not present

## 2020-06-28 DIAGNOSIS — M9903 Segmental and somatic dysfunction of lumbar region: Secondary | ICD-10-CM | POA: Diagnosis not present

## 2020-06-28 LAB — IGE FOOD PROF W/COMPONENT RFLX
Allergen Corn, IgE: <0.1 kU/L
Clam IgE: <0.1 kU/L
Codfish IgE: <0.1 kU/L
F001-IgE Egg White: <0.1 kU/L
F002-IgE Milk: <0.1 kU/L
Peanut, IgE: 0.59 kU/L — AB
Scallop IgE: <0.1 kU/L
Sesame Seed IgE: <0.1 kU/L
Shrimp IgE: <0.1 kU/L
Soybean IgE: <0.1 kU/L
Walnut IgE: <0.1 kU/L
Wheat IgE: <0.1 kU/L

## 2020-06-28 LAB — PEANUT COMPONENTS
F352-IgE Ara h 8: 2.65 kU/L — AB
F422-IgE Ara h 1: <0.1 kU/L
F423-IgE Ara h 2: <0.1 kU/L
F424-IgE Ara h 3: <0.1 kU/L
F427-IgE Ara h 9: <0.1 kU/L
F447-IgE Ara h 6: <0.1 kU/L

## 2020-06-28 LAB — ALLERGEN COMPONENT COMMENTS

## 2020-07-01 ENCOUNTER — Encounter: Payer: Self-pay | Admitting: Gastroenterology

## 2020-07-03 DIAGNOSIS — F4322 Adjustment disorder with anxiety: Secondary | ICD-10-CM | POA: Diagnosis not present

## 2020-07-10 NOTE — Progress Notes (Signed)
Office Visit Note  Patient: Laura Fuentes             Date of Birth: 1980-03-20           MRN: [id number]             PCP: Lesleigh Noe, MD Referring: Lesleigh Noe, MD Visit Date: 07/11/2020 Occupation: @GUAROCC @  Subjective:  Pain in multiple joints and uveitis.   History of Present Illness: Laura Fuentes is a 40 y.o. female seen in consultation per request of her PCP.  According to the patient her symptoms a started in May 2021 with rash on her arms and lower extremities.  She states her rash got worse after the second COVID vaccination.  She continues to have rash about once a month.  The rash was mostly on her upper extremities and 1 time on her left leg.  She saw a dermatologist in [place] who thought it was shingles.  She also had a telehealth visit with a dermatologist who thought it was related to the insect bite.  She states in March 2022 she had a stressful family situation and after that she started having abdominal discomfort and bloating.  As her symptoms got worse and she had 20 pounds weight loss she was evaluated by gastroenterologist Dr.Vanga who did a colonoscopy and diagnosed her with Crohn's colitis.  She was added with oral prednisone and then was advised anti-TNF's but patient declined.  She had been going to functional medicine and did some dietary modifications which were useful.  She also had a Lyme test done which was only positive for 4 markers.  She states as soon as the GI symptoms started she also started having SI joint pain.  She was having significant morning stiffness and joint pain in her hands, knees and her ankles.  In May 2022 she developed right knee joint effusion and was seen by a sports medicine doctor who did x-ray of the right knee joint which was unremarkable except for a small effusion.  She also had SI joint x-rays which were unremarkable.  The knee joint was not aspirated or injected.  The symptoms resolved after using some topical agents and  ibuprofen.  She continues to have some stiffness in her joints but not as prominent.  She was also seen by chiropractor.  At the same time when her GI symptoms a started she also noticed redness in both eyes and was seen by Dr. Wallace Going.  She was diagnosed with uveitis.  She was treated with steroid eyedrops and oral prednisone.  The symptoms gradually improved and resolved.  But at her follow-up visit she was found to have papilledema.  He did MRI of her brain which showed some white matter changes. She currently does not have any diarrhea.  She states she has off-and-on GI symptoms which she believes is due to IBS.  Activities of Daily Living:  Patient reports morning stiffness for all day. Patient Denies nocturnal pain.  Difficulty dressing/grooming: Denies Difficulty climbing stairs: Denies Difficulty getting out of chair: Denies Difficulty using hands for taps, buttons, cutlery, and/or writing: Denies  Review of Systems  Constitutional:  Positive for fatigue.  HENT:  Negative for mouth sores, mouth dryness and nose dryness.   Eyes:  Positive for redness. Negative for pain, itching and dryness.  Respiratory:  Negative for cough, shortness of breath, wheezing and difficulty breathing.   Cardiovascular:  Negative for chest pain and palpitations.  Gastrointestinal:  Negative for blood  in stool, constipation and diarrhea.  Endocrine: Negative for increased urination.  Genitourinary:  Negative for difficulty urinating.  Musculoskeletal:  Positive for joint pain, joint pain, joint swelling and morning stiffness. Negative for myalgias, muscle tenderness and myalgias.  Skin:  Negative for color change, rash, hair loss and redness.  Allergic/Immunologic: Negative for susceptible to infections.  Neurological:  Positive for dizziness and weakness. Negative for numbness, headaches and memory loss.  Hematological:  Negative for bruising/bleeding tendency.  Psychiatric/Behavioral:  Negative for  confusion.    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   Hives 06/21/2020   Crohn's disease ([place]) 06/21/2020   Polyarthralgia 05/07/2020   Weight loss 05/07/2020   Colitis 05/07/2020   Lower abdominal pain 04/24/2020   Asymptomatic microscopic hematuria 04/24/2020   Rash and nonspecific skin eruption 08/31/2019   Weight gain 11/22/2018   Lymphadenopathy, axillary 11/22/2018   Seasonal allergies 03/29/2018   Acute stress reaction 03/02/2018   Bilateral hand pain 11/19/2017   History of trigger finger 11/19/2017    Past Medical History:  Diagnosis Date   BCC (basal cell carcinoma of skin)    COVID-19 01/2020   Elevated fecal calprotectin    History of asthma    History of pre-eclampsia     Family History  Problem Relation Age of Onset   Depression Mother    Irritable bowel syndrome Mother    Basal cell carcinoma Father    Depression Sister    Alzheimer's disease Maternal Grandmother    Colon cancer Maternal Grandmother 57   Alcohol abuse Maternal Grandfather    Suicidality Maternal Grandfather    Emphysema Paternal Grandmother    Crohn's disease Paternal Grandmother    Colon cancer Paternal Grandfather 29   Healthy Daughter    Healthy Daughter    Breast cancer Neg Hx    Past Surgical History:  Procedure Laterality Date   BIOPSY N/A 05/17/2020   Procedure: BIOPSY;  Surgeon: Lin Landsman, MD;  Location: [facility];  Service: Endoscopy;  Laterality: N/A;   CESAREAN SECTION     COLONOSCOPY WITH PROPOFOL N/A 05/17/2020   Procedure: COLONOSCOPY WITH PROPOFOL;  Surgeon: Lin Landsman, MD;  Location: [facility];  Service: Endoscopy;  Laterality: N/A;   ESOPHAGOGASTRODUODENOSCOPY (EGD) WITH PROPOFOL N/A 05/17/2020   Procedure: ESOPHAGOGASTRODUODENOSCOPY (EGD) WITH PROPOFOL;  Surgeon: Lin Landsman, MD;  Location: [facility];  Service: Endoscopy;  Laterality: N/A;    TONSILLECTOMY  2005   Social History   Social History Narrative   Lives with Ronalee Belts   [employer]    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  Constitutional:      Appearance: She is well-developed.  HENT:     Head: Normocephalic and  atraumatic.  Eyes:     Conjunctiva/sclera: Conjunctivae normal.  Cardiovascular:     Rate and Rhythm: Normal rate and regular rhythm.     Heart sounds: Normal heart sounds.  Pulmonary:     Effort: Pulmonary effort is normal.     Breath sounds: Normal breath sounds.  Abdominal:     General: Bowel sounds are normal.     Palpations: Abdomen is soft.  Musculoskeletal:     Cervical back: Normal range of motion.  Lymphadenopathy:     Cervical: No cervical adenopathy.  Skin:    General: Skin is warm and dry.     Capillary Refill: Capillary refill takes less than 2 seconds.  Neurological:     Mental Status: She is alert and oriented to person, place, and time.  Psychiatric:        Behavior: Behavior normal.     Musculoskeletal Exam: C-spine thoracic and lumbar spine were in good range of motion.  She had no SI joint tenderness.  Shoulder joints, elbow joints, wrist joints, MCPs PIPs and DIPs with good range of motion with no synovitis.  Hip joints, knee joints, ankles, MTPs and PIPs with good range of motion with no synovitis.  CDAI  Exam: CDAI Score: -- Patient Global: --; Provider Global: -- Swollen: --; Tender: -- Joint Exam 07/11/2020   No joint exam has been documented for this visit   There is currently no information documented on the homunculus. Go to the Rheumatology activity and complete the homunculus joint exam.  Investigation: No additional findings.  Imaging: DG Si Joints  Result Date: 06/22/2020 CLINICAL DATA:  SI joint pain EXAM: BILATERAL SACROILIAC JOINTS - 3+ VIEW COMPARISON:  None. FINDINGS: The sacroiliac joint spaces are maintained and there is no evidence of arthropathy. No other bone abnormalities are seen. IMPRESSION: Negative. Electronically Signed   By: Franchot Gallo M.D.   On: 06/22/2020 13:17    Recent Labs: Lab Results  Component Value Date   WBC 9.6 04/28/2020   HGB 14.9 04/28/2020   PLT 379 04/28/2020   NA 138 04/28/2020   K 4.3 04/28/2020   CL 104 04/28/2020   CO2 30 04/28/2020   GLUCOSE 95 04/28/2020   BUN 6 04/28/2020   CREATININE 0.75 04/28/2020   BILITOT 0.6 04/28/2020   ALKPHOS 98 04/28/2020   AST 17 04/28/2020   ALT 17 04/28/2020   PROT 7.8 04/28/2020   ALBUMIN 4.3 04/28/2020   CALCIUM 9.9 04/28/2020   QFTBGOLDPLUS CANCELED 05/18/2020    Speciality Comments: No specialty comments available.  Procedures:  No procedures performed Allergies: Citrus   Assessment / Plan:     Visit Diagnoses: Positive ANA (antinuclear antibody) - 05/07/20: ANA 1:40 NS, B burgdorferi IgG negative, Lyme disease IgG reactive, HLA-B27-, vitamin B12 386, Vitamin D 28/08, ferritin 125.6 -  She is concerned about possible autoimmune disease.  I will obtain following labs today.  There is no history of oral ulcers, nasal ulcers, sicca symptoms, malar rash, photosensitivity.  She has been having intermittent rash.  She also had lymphadenopathy 1 year ago.  She gives history of intermittent discomfort in the parotid region bilaterally.  Plan: Anti-DNA antibody, double-stranded, Sjogrens  syndrome-B extractable nuclear antibody, Anti-Smith antibody, RNP Antibody, C3 and C4  Uveitis - She had been under care of Dr. Wallace Going.  She has had uveitis for the last 3 to 4 months.  It responded to topical steroid eyedrops.  She was also on oral prednisone at that time.  She  was find to have papilledema at the last examination.  MRI of the brain was abnormal.  Plan: Pan-ANCA, Fluorescent treponemal ab(fta)-IgG-bld, Angiotensin converting enzyme, Rheumatoid factor, Cyclic citrul peptide antibody, IgG, QuantiFERON-TB Gold Plus, Serum protein electrophoresis with reflex, IgG, IgA, IgM, Sjogrens syndrome-A extractable nuclear antibody, DG Chest 2 View  Abnormal brain MRI-I will refer her to neurology for further evaluation.  She had white matter changes on the MRI.  Crohn's disease without complication, unspecified gastrointestinal tract location (HCC)-diagnosed by Dr. Marius Ditch.  Patient states she was given oral steroids which helped her symptoms.  She declined Biologics.  She had been going to functional medicine clinic and she states she is currently asymptomatic.  She states she has intermittent bouts of diarrhea which she relates to IBS.  She is also going to acupuncture.  Effusion, right knee-she developed an effusion in the right knee in May.  She states her symptoms resolved with ibuprofen and topical agents.She currently denies any joint swelling.  She states she continues to have some stiffness in her knee joints.I reviewed x-rays of her right knee joint which showed mild effusion.  Chronic SI joint pain-she started having SI joint pain when her Crohn's symptoms a started.  The symptoms of Crohn's disease have improved and the SI joint pain has improved.I reviewed x-rays of her SI joints which were unremarkable.  Polyarthralgia-she has experienced intermittent discomfort and stiffness in her hands.  She had no synovitis on my examination.  Rash and nonspecific skin eruption-she brought  pictures of rash on her iPhone.  She had few scattered maculopapular lesions on her forearm.  Lymphadenopathy, axillary - 2020, probably related to tattoo removal per patient.Marland Kitchen Resolved now.  Seasonal allergies-currently not very symptomatic.  Orders: Orders Placed This Encounter  Procedures   DG Chest 2 View   Pan-ANCA   Fluorescent treponemal ab(fta)-IgG-bld   Angiotensin converting enzyme   Rheumatoid factor   Cyclic citrul peptide antibody, IgG   QuantiFERON-TB Gold Plus   Serum protein electrophoresis with reflex   IgG, IgA, IgM   Sjogrens syndrome-A extractable nuclear antibody   Anti-DNA antibody, double-stranded   Sjogrens syndrome-B extractable nuclear antibody   Anti-Smith antibody   RNP Antibody   C3 and C4   Ambulatory referral to Neurology    No orders of the defined types were placed in this encounter.    Follow-Up Instructions: Return for Crohn's, uveitis, polyarthralgia.   Bo Merino, MD  Note - This record has been created using Editor, commissioning.  Chart creation errors have been sought, but may not always  have been located. Such creation errors do not reflect on  the standard of medical care.

## 2020-07-11 ENCOUNTER — Other Ambulatory Visit: Payer: Self-pay

## 2020-07-11 ENCOUNTER — Encounter: Payer: Self-pay | Admitting: Rheumatology

## 2020-07-11 ENCOUNTER — Ambulatory Visit (INDEPENDENT_AMBULATORY_CARE_PROVIDER_SITE_OTHER): Payer: BC Managed Care – PPO | Admitting: Rheumatology

## 2020-07-11 VITALS — BP 113/73 | HR 80 | Resp 14 | Ht 69.0 in | Wt 163.0 lb

## 2020-07-11 DIAGNOSIS — K5090 Crohn's disease, unspecified, without complications: Secondary | ICD-10-CM | POA: Diagnosis not present

## 2020-07-11 DIAGNOSIS — R768 Other specified abnormal immunological findings in serum: Secondary | ICD-10-CM

## 2020-07-11 DIAGNOSIS — R21 Rash and other nonspecific skin eruption: Secondary | ICD-10-CM | POA: Diagnosis not present

## 2020-07-11 DIAGNOSIS — Z111 Encounter for screening for respiratory tuberculosis: Secondary | ICD-10-CM | POA: Diagnosis not present

## 2020-07-11 DIAGNOSIS — M2550 Pain in unspecified joint: Secondary | ICD-10-CM

## 2020-07-11 DIAGNOSIS — K529 Noninfective gastroenteritis and colitis, unspecified: Secondary | ICD-10-CM

## 2020-07-11 DIAGNOSIS — H209 Unspecified iridocyclitis: Secondary | ICD-10-CM | POA: Diagnosis not present

## 2020-07-11 DIAGNOSIS — R9089 Other abnormal findings on diagnostic imaging of central nervous system: Secondary | ICD-10-CM

## 2020-07-11 DIAGNOSIS — R590 Localized enlarged lymph nodes: Secondary | ICD-10-CM

## 2020-07-11 DIAGNOSIS — M533 Sacrococcygeal disorders, not elsewhere classified: Secondary | ICD-10-CM

## 2020-07-11 DIAGNOSIS — R3121 Asymptomatic microscopic hematuria: Secondary | ICD-10-CM

## 2020-07-11 DIAGNOSIS — G8929 Other chronic pain: Secondary | ICD-10-CM

## 2020-07-11 DIAGNOSIS — J302 Other seasonal allergic rhinitis: Secondary | ICD-10-CM

## 2020-07-11 DIAGNOSIS — M25461 Effusion, right knee: Secondary | ICD-10-CM

## 2020-07-12 DIAGNOSIS — F4323 Adjustment disorder with mixed anxiety and depressed mood: Secondary | ICD-10-CM | POA: Diagnosis not present

## 2020-07-12 DIAGNOSIS — H209 Unspecified iridocyclitis: Secondary | ICD-10-CM | POA: Diagnosis not present

## 2020-07-12 DIAGNOSIS — H4710 Unspecified papilledema: Secondary | ICD-10-CM | POA: Diagnosis not present

## 2020-07-12 DIAGNOSIS — F410 Panic disorder [episodic paroxysmal anxiety] without agoraphobia: Secondary | ICD-10-CM | POA: Diagnosis not present

## 2020-07-14 LAB — PROTEIN ELECTROPHORESIS, SERUM, WITH REFLEX
Albumin ELP: 4.3 g/dL (ref 3.8–4.8)
Alpha 1: 0.4 g/dL — ABNORMAL HIGH (ref 0.2–0.3)
Alpha 2: 0.7 g/dL (ref 0.5–0.9)
Beta 2: 0.5 g/dL (ref 0.2–0.5)
Beta Globulin: 0.5 g/dL (ref 0.4–0.6)
Gamma Globulin: 1.3 g/dL (ref 0.8–1.7)
Total Protein: 7.6 g/dL (ref 6.1–8.1)

## 2020-07-14 LAB — ANGIOTENSIN CONVERTING ENZYME: Angiotensin-Converting Enzyme: 40 U/L (ref 9–67)

## 2020-07-14 LAB — RNP ANTIBODY: Ribonucleic Protein(ENA) Antibody, IgG: <1 AI

## 2020-07-14 LAB — IGG, IGA, IGM
IgG (Immunoglobin G), Serum: 1257 mg/dL (ref 600–1640)
IgM, Serum: 231 mg/dL (ref 50–300)
Immunoglobulin A: 393 mg/dL — ABNORMAL HIGH (ref 47–310)

## 2020-07-14 LAB — QUANTIFERON-TB GOLD PLUS
Mitogen-NIL: >10 IU/mL
NIL: 0.04 IU/mL
QuantiFERON-TB Gold Plus: NEGATIVE
TB1-NIL: 0 IU/mL
TB2-NIL: 0 IU/mL

## 2020-07-14 LAB — RHEUMATOID FACTOR: Rheumatoid fact SerPl-aCnc: <14 IU/mL (ref <14)

## 2020-07-14 LAB — PAN-ANCA
ANCA Screen: POSITIVE — AB
Atypical P-ANCA titer: 1:40 {titer} — ABNORMAL HIGH
Myeloperoxidase Abs: <1 AI
Serine Protease 3: <1 AI

## 2020-07-14 LAB — ANTI-SMITH ANTIBODY: ENA SM Ab Ser-aCnc: <1 AI

## 2020-07-14 LAB — SJOGRENS SYNDROME-A EXTRACTABLE NUCLEAR ANTIBODY: SSA (Ro) (ENA) Antibody, IgG: <1 AI

## 2020-07-14 LAB — CYCLIC CITRUL PEPTIDE ANTIBODY, IGG: Cyclic Citrullin Peptide Ab: <16 UNITS

## 2020-07-14 LAB — C3 AND C4
C3 Complement: 164 mg/dL (ref 83–193)
C4 Complement: 34 mg/dL (ref 15–57)

## 2020-07-14 LAB — ANTI-DNA ANTIBODY, DOUBLE-STRANDED: ds DNA Ab: 3 IU/mL

## 2020-07-14 LAB — SJOGRENS SYNDROME-B EXTRACTABLE NUCLEAR ANTIBODY: SSB (La) (ENA) Antibody, IgG: <1 AI

## 2020-07-14 LAB — FLUORESCENT TREPONEMAL AB(FTA)-IGG-BLD: Fluorescent Treponemal ABS: NONREACTIVE

## 2020-07-15 NOTE — Progress Notes (Signed)
I will discuss results at the fu visit. Please schedule an earlier appointment on 07/24/20 if possible.

## 2020-07-17 DIAGNOSIS — F4322 Adjustment disorder with anxiety: Secondary | ICD-10-CM | POA: Diagnosis not present

## 2020-08-12 NOTE — Progress Notes (Signed)
Office Visit Note  Patient: Laura Fuentes             Date of Birth: June 24, 1980           MRN: [id number]             PCP: Lesleigh Noe, MD Referring: Lesleigh Noe, MD Visit Date: 08/24/2020 Occupation: @GUAROCC @  Subjective:  Follow-up (Doing good)   History of Present Illness: Laura Fuentes is a 40 y.o. female with a history of Crohn's disease and uveitis.  She states she has not had a recurrence of uveitis in a while.  She states her Crohn's disease is stable.  She has been trying some natural supplements which are helping.  She is not interested in taking any DMARDs or Biologics.  Activities of Daily Living:  Patient reports morning stiffness for 30 minutes.   Patient Denies nocturnal pain.  Difficulty dressing/grooming: Denies Difficulty climbing stairs: Denies Difficulty getting out of chair: Denies Difficulty using hands for taps, buttons, cutlery, and/or writing: Denies  Review of Systems  Constitutional:  Negative for fatigue.  HENT:  Negative for mouth dryness.   Eyes:  Negative for dryness.  Respiratory:  Negative for shortness of breath.   Cardiovascular:  Negative for swelling in legs/feet.  Gastrointestinal:  Positive for diarrhea.  Endocrine: Negative for excessive thirst.  Genitourinary:  Negative for difficulty urinating.  Musculoskeletal:  Positive for morning stiffness.  Skin:  Negative for rash.  Allergic/Immunologic: Negative for susceptible to infections.  Neurological:  Negative for numbness.  Hematological:  Negative for bruising/bleeding tendency.  Psychiatric/Behavioral:  Negative for sleep disturbance.    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   Hives 06/21/2020   Crohn's disease ([place]) 06/21/2020   Polyarthralgia 05/07/2020   Weight loss 05/07/2020   Colitis 05/07/2020   Lower abdominal pain 04/24/2020   Asymptomatic microscopic hematuria 04/24/2020   Rash and  nonspecific skin eruption 08/31/2019   Weight gain 11/22/2018   Lymphadenopathy, axillary 11/22/2018   Seasonal allergies 03/29/2018   Acute stress reaction 03/02/2018   Bilateral hand pain 11/19/2017   History of trigger finger 11/19/2017    Past Medical History:  Diagnosis Date   BCC (basal cell carcinoma of skin)    COVID-19 01/2020   Crohn's disease ([place])    Elevated fecal calprotectin    History of asthma    History of pre-eclampsia    Uveitis     Family History  Problem Relation Age of Onset   Depression Mother    Irritable bowel syndrome Mother    Basal cell carcinoma Father    Depression Sister    Alzheimer's disease Maternal Grandmother    Colon cancer Maternal Grandmother 61   Alcohol abuse Maternal Grandfather    Suicidality Maternal Grandfather    Emphysema Paternal Grandmother    Crohn's disease Paternal Grandmother    Colon cancer Paternal Grandfather 41   Healthy Daughter    Healthy Daughter    Breast cancer Neg Hx    Past Surgical History:  Procedure Laterality Date   BIOPSY N/A 05/17/2020   Procedure: BIOPSY;  Surgeon: Lin Landsman, MD;  Location: [facility];  Service: Endoscopy;  Laterality: N/A;   CESAREAN SECTION     COLONOSCOPY WITH PROPOFOL N/A 05/17/2020   Procedure: COLONOSCOPY WITH PROPOFOL;  Surgeon: Lin Landsman, MD;  Location: [facility];  Service: Endoscopy;  Laterality: N/A;  ESOPHAGOGASTRODUODENOSCOPY (EGD) WITH PROPOFOL N/A 05/17/2020   Procedure: ESOPHAGOGASTRODUODENOSCOPY (EGD) WITH PROPOFOL;  Surgeon: Lin Landsman, MD;  Location: [facility];  Service: Endoscopy;  Laterality: N/A;   TONSILLECTOMY  2005   Social History   Social History Narrative   Lives with Ronalee Belts   [employer]    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  Constitutional:      Appearance: She is well-developed.  HENT:     Head: Normocephalic and atraumatic.  Eyes:     Conjunctiva/sclera: Conjunctivae normal.  Cardiovascular:     Rate and Rhythm: Normal rate and regular rhythm.     Heart sounds: Normal heart sounds.  Pulmonary:     Effort: Pulmonary effort is normal.     Breath sounds: Normal breath sounds.  Abdominal:     General: Bowel sounds are normal.     Palpations: Abdomen is soft.  Musculoskeletal:     Cervical back: Normal range of motion.  Lymphadenopathy:     Cervical: No cervical adenopathy.  Skin:    General: Skin is warm and dry.     Capillary Refill: Capillary refill takes less than 2 seconds.  Neurological:     Mental Status: She is alert and oriented to person, place, and time.  Psychiatric:        Behavior: Behavior normal.     Musculoskeletal Exam: She is was in good range of motion.  She had no SI joint tenderness.  Shoulder joints, elbow joints, wrist joints, MCPs PIPs and DIPs with good range of motion with no synovitis.  Hip joints, knee joints, ankles, MTPs and PIPs with good range of motion with no synovitis.  CDAI Exam: CDAI Score: -- Patient Global: --; Provider Global: -- Swollen: --; Tender: -- Joint Exam 08/24/2020   No joint exam has been documented for this visit   There is currently no information documented on the homunculus. Go to the Rheumatology activity and complete the homunculus joint exam.  Investigation: No additional  findings.  Imaging: No results found.  Recent Labs: Lab Results  Component Value Date   WBC 9.6 04/28/2020   HGB 14.9 04/28/2020   PLT 379 04/28/2020   NA 138 04/28/2020   K 4.3 04/28/2020   CL 104 04/28/2020   CO2 30 04/28/2020   GLUCOSE 95 04/28/2020   BUN 6 04/28/2020   CREATININE 0.75 04/28/2020   BILITOT 0.6 04/28/2020   ALKPHOS 98 04/28/2020   AST 17 04/28/2020   ALT 17 04/28/2020   PROT 7.6 07/11/2020   ALBUMIN 4.3 04/28/2020   CALCIUM 9.9 04/28/2020   QFTBGOLDPLUS NEGATIVE 07/11/2020    July 11, 2020 SPEP normal, immunoglobulins normal except IgA 393, TB Gold negative, P-ANCA 1: 40, MPO negative, serine protease 3 negative, FTA antibody negative, ACE 40, RF negative, anti-CCP negative,  ANA (SSA, SSB, dsDNA, Smith, RNP negative), C3-C4 normal  05/07/20: ANA 1:40 NS, B burgdorferi IgG negative, Lyme disease IgG reactive, HLA-B27-, vitamin B12 386, Vitamin D 28/08, ferritin 125.6  Speciality Comments: No specialty comments available.  Procedures:  No procedures performed Allergies: Citrus   Assessment / Plan:     Visit Diagnoses: Uveitis - History of uveitis for the last 4 months.  Treated by Dr. Wallace Going with topical steroids and oral prednisone.  Papilledema was also noted.  Patient states her uveitis symptoms have improved.  I believe her uveitis is related to Crohn's disease.  She may benefit from more aggressive therapy but she is not interested in use of DMARDs or Biologics at this time.  She is using natural therapy.  I advised her to contact me in case she develops more symptoms and she wants more aggressive therapy.  Abnormal brain MRI - MRI of the brain showed white matter changes.  She has an appointment with Dr. Krista Blue in September.  P-ANCA 1: 40 not a significant titer.  All other autoimmune work-up was negative.  Crohn's disease without complication, unspecified gastrointestinal tract location [facility]) - Close by Dr. Marius Ditch.  Treated with oral steroids.  She  declined Biologics and is going to functional medicine clinic.  She gives history of diarrhea due to IBS.  Pain in both hands - History of intermittent pain and discomfort in bilateral hands.  No synovitis was noted.  Effusion, right knee - History of right knee joint effusion and May 2022.  Resolved after ibuprofen and topical agents.  X-ray of the right knee showed mild effusion.  She had no effusion on the examination today.  Chronic SI joint pain - Currently asymptomatic.  SI joint x-rays were unremarkable.  Positive ANA (antinuclear antibody) - Positive ANA low titer, not significant.  ENA negative and complements normal.  Rash and nonspecific skin eruption - She showed me few scattered maculopapular lesions over her forearms on her cell phone at the last visit.  She had no recurrence of the rash.  Lymphadenopathy, axillary - In 2020.  Patient relates it to possible tattoo removal.  Lymphadenopathy resolved after few weeks.  Seasonal allergies  Orders: No orders of the defined types were placed in this encounter.  No orders of the defined types were placed in this encounter.    Follow-Up Instructions: Return if symptoms worsen or fail to improve, for Crohn's disease, uveitis.   Bo Merino, MD  Note - This record has been created using Editor, commissioning.  Chart creation errors have been sought, but may not always  have been located. Such creation errors do not reflect on  the standard of medical care.

## 2020-08-21 ENCOUNTER — Encounter: Payer: Self-pay | Admitting: Rheumatology

## 2020-08-21 DIAGNOSIS — H30033 Focal chorioretinal inflammation, peripheral, bilateral: Secondary | ICD-10-CM | POA: Diagnosis not present

## 2020-08-21 DIAGNOSIS — H4710 Unspecified papilledema: Secondary | ICD-10-CM | POA: Diagnosis not present

## 2020-08-22 ENCOUNTER — Ambulatory Visit: Payer: BC Managed Care – PPO | Admitting: Rheumatology

## 2020-08-22 DIAGNOSIS — F4323 Adjustment disorder with mixed anxiety and depressed mood: Secondary | ICD-10-CM | POA: Diagnosis not present

## 2020-08-24 ENCOUNTER — Other Ambulatory Visit: Payer: Self-pay

## 2020-08-24 ENCOUNTER — Ambulatory Visit (INDEPENDENT_AMBULATORY_CARE_PROVIDER_SITE_OTHER): Payer: BC Managed Care – PPO | Admitting: Rheumatology

## 2020-08-24 ENCOUNTER — Encounter: Payer: Self-pay | Admitting: Rheumatology

## 2020-08-24 VITALS — BP 107/69 | HR 58 | Resp 14 | Ht 69.0 in | Wt 167.0 lb

## 2020-08-24 DIAGNOSIS — K5090 Crohn's disease, unspecified, without complications: Secondary | ICD-10-CM | POA: Diagnosis not present

## 2020-08-24 DIAGNOSIS — R9089 Other abnormal findings on diagnostic imaging of central nervous system: Secondary | ICD-10-CM

## 2020-08-24 DIAGNOSIS — M79641 Pain in right hand: Secondary | ICD-10-CM | POA: Diagnosis not present

## 2020-08-24 DIAGNOSIS — H209 Unspecified iridocyclitis: Secondary | ICD-10-CM | POA: Diagnosis not present

## 2020-08-24 DIAGNOSIS — M79642 Pain in left hand: Secondary | ICD-10-CM

## 2020-08-24 DIAGNOSIS — M533 Sacrococcygeal disorders, not elsewhere classified: Secondary | ICD-10-CM

## 2020-08-24 DIAGNOSIS — R768 Other specified abnormal immunological findings in serum: Secondary | ICD-10-CM

## 2020-08-24 DIAGNOSIS — G8929 Other chronic pain: Secondary | ICD-10-CM

## 2020-08-24 DIAGNOSIS — M25461 Effusion, right knee: Secondary | ICD-10-CM

## 2020-08-24 DIAGNOSIS — R21 Rash and other nonspecific skin eruption: Secondary | ICD-10-CM

## 2020-08-24 DIAGNOSIS — J302 Other seasonal allergic rhinitis: Secondary | ICD-10-CM

## 2020-08-24 DIAGNOSIS — R590 Localized enlarged lymph nodes: Secondary | ICD-10-CM

## 2020-08-29 DIAGNOSIS — F4322 Adjustment disorder with anxiety: Secondary | ICD-10-CM | POA: Diagnosis not present

## 2020-08-30 ENCOUNTER — Other Ambulatory Visit: Payer: Self-pay

## 2020-08-30 ENCOUNTER — Encounter: Payer: Self-pay | Admitting: Neurology

## 2020-08-30 ENCOUNTER — Encounter: Payer: Self-pay | Admitting: Gastroenterology

## 2020-08-30 ENCOUNTER — Other Ambulatory Visit: Payer: Self-pay | Admitting: Rheumatology

## 2020-08-30 ENCOUNTER — Ambulatory Visit (INDEPENDENT_AMBULATORY_CARE_PROVIDER_SITE_OTHER): Payer: BC Managed Care – PPO | Admitting: Neurology

## 2020-08-30 ENCOUNTER — Encounter: Payer: Self-pay | Admitting: Family Medicine

## 2020-08-30 VITALS — BP 125/67 | HR 68 | Ht 69.0 in | Wt 167.5 lb

## 2020-08-30 DIAGNOSIS — H4710 Unspecified papilledema: Secondary | ICD-10-CM | POA: Diagnosis not present

## 2020-08-30 DIAGNOSIS — H3581 Retinal edema: Secondary | ICD-10-CM | POA: Insufficient documentation

## 2020-08-30 NOTE — Progress Notes (Signed)
Chief Complaint  Patient presents with   New Patient (Initial Visit)    New Patient: Internal Referral, referred by Dr. Einar Pheasant, diagnosed with Chron's and having swelling on the optic nerve and brain, MRI done prior to visit New Room: Alone in room      ASSESSMENT AND PLAN  Laura Fuentes is a 40 y.o. 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  Labs, normal C3,4, RNP, anti-Smith, Sjogren's antibody, anti-DNA, KP QuantiFERON testing, rheumatoid factor, angiotension conversion enzyme, hepatitis ABC, normal B12, Vit D 28,  Positive ANCA screen, with atypical p-ANCA titer of 1-40, positive ANA,  positive Lyme antibody  MEDICAL HISTORY:  Laura Fuentes is a 40 year old female, seen in request by her primary care physician Dr. Einar Pheasant, Janett Billow for evaluation of Crohn's disease, papillary edema, initial evaluation was on September 07, 2020  I reviewed and summarized the referring note. PMHX.  She was diagnosed with Crohn's disease in April 2022, presented with painful cramping, diarrhea, lost 20 pounds in 2 weeks, her symptoms was very effectively controlled by tapering dose of prednisone, with her generalized symptoms, she also developed a rash in her back, multiple joints pain, knees swelled up, blurry vision,  Diagnosis was confirmed by colonoscopy, biopsy, per patient, there was also noted at small and large  intestine,  She developed visual symptoms 2 weeks following her GI symptoms, "I want bloodshot, also have blurry vision, was also seen by ophthalmologist, was diagnosed with uveitis, also found to have papilledema, vision symptoms also quickly improved with steroid treatment,  She was doing really well in June July when she was visiting, relaxing in [place],  Recently she noticed mild recurrent symptoms, she often felt dizzy lightheaded when standing up, both vision would dim for a few seconds, needs time to readjust,  MRI brain without contrast report on June 22, 2020 from [place] [facility], a few small T2/hyperintensity in bilateral frontal cerebral hemisphere white matter and a possible small hyper intensity in the right pons, these are nonspecific, carries a broad differentiation diagnosis, including chronic small vessel disease, demyelinating disease, sequelae of vasculitis, chronic migraine or other process, no evidence of acute infarction.  She never had confusion, severe headache during the process, no evidence of intracranial involvement by history, MRI findings,  I reviewed ophthalmology Dr. Manuella Ghazi evaluation of August 21, 2020  1. Peripheral focal chorioretinal inflammation of both eyes  2. Retinal edema Macula OCT - OU - Both Eyes  3. Optic nerve swelling   1. Iridocyclitis of both eyes - Of note, she has hx of Crohn's and has no hx of systemic therapy for treatment - Patient shows signs of active inflammation on ocular exam today 08/21/20 - I have discussed the vision threatening nature/potentially blinding disease which requires chemotherapy/immune suppression. I have discussed in great detail the medication along with reviewing all the systemic implications.  - Giving her clinical exam, I am concerned about low-grade inflammation in both  eyes. I have discussed nature and mechanism of Crohn's and its relation to ocular inflammation. I recommend obtaining FA to further evaluate  her inflammation. Patient expressed understanding (08/21/20)   PHYSICAL EXAM:   Vitals:   08/30/20 0802  BP: 125/67  Pulse: 68  Weight: 167 lb 8 oz (76 kg)  Height: 5' 9"  (1.753 m)   Not recorded     Body mass index is 24.74 kg/m.  PHYSICAL EXAMNIATION:  Gen: NAD, conversant, well nourised, well groomed                     Cardiovascular: Regular rate rhythm, no peripheral edema, warm, nontender. Eyes: Conjunctivae clear without exudates or hemorrhage Neck: Supple, no carotid bruits. Pulmonary: Clear to auscultation bilaterally   NEUROLOGICAL EXAM:  MENTAL STATUS: Speech:    Speech is normal; fluent and spontaneous with normal comprehension.  Cognition:     Orientation to time, place and person     Normal recent and remote memory     Normal Attention span and concentration     Normal Language, naming, repeating,spontaneous speech     Fund of knowledge   CRANIAL NERVES: CN II: Visual fields are full to confrontation. Pupils are round equal and briskly reactive to light. CN III, IV, VI: extraocular movement are normal. No ptosis. CN V: Facial sensation is intact to light touch CN VII: Face is symmetric with normal eye closure  CN VIII: Hearing is normal to causal conversation. CN IX, X: Phonation is normal. CN XI: Head turning and shoulder shrug are intact  MOTOR: There is no pronator drift of out-stretched arms. Muscle bulk and tone are normal. Muscle strength is normal.  REFLEXES: Reflexes are 2+ and symmetric at the biceps, triceps, knees, and ankles. Plantar responses are flexor.  SENSORY: Intact to light touch, pinprick and vibratory sensation are intact in fingers and toes.  COORDINATION: There is no trunk or limb dysmetria noted.  GAIT/STANCE: Posture is normal. Gait is steady with normal steps, base, arm swing, and turning. Heel and toe walking are normal. Tandem gait is normal.  Romberg is absent.  REVIEW OF SYSTEMS:  Full 14 system review of  systems performed and notable only for as above All other review of systems were negative.   ALLERGIES: No Active Allergies  HOME MEDICATIONS: Current Outpatient Medications  Medication Sig Dispense Refill   ALPRAZolam (XANAX) 1 MG tablet TAKE 1 TABLET BY MOUTH UP TO ONCE DAILY AS NEEDED FOR ANXIETY, NOT TO EXCEED 20 TABLETS PER MONTH     CHLOROPHYLL PO Take by mouth daily.     Cholecalciferol (VITAMIN D3 PO) Take by mouth. With K2     Glutamine 500 MG CAPS Take by mouth daily.     MAGNESIUM PO Take by mouth daily.     Multiple Vitamin (MULTIVITAMIN PO) Take by mouth daily.     OVER THE COUNTER MEDICATION daily. Gastro-fiber     OVER THE COUNTER MEDICATION daily. Lyme nosode     OVER THE COUNTER MEDICATION daily. Drenatrophin     OVER THE COUNTER MEDICATION daily. CBG and CBD oil     OVER THE COUNTER MEDICATION Biotoxin binder     VITAMIN A PO Take by mouth daily.     No current facility-administered medications for this visit.    PAST MEDICAL HISTORY: Past Medical History:  Diagnosis Date   BCC (basal cell carcinoma of skin)    COVID-19 01/2020   Crohn's disease ([place])  Elevated fecal calprotectin    History of asthma    History of pre-eclampsia    Uveitis     PAST SURGICAL HISTORY: Past Surgical History:  Procedure Laterality Date   BIOPSY N/A 05/17/2020   Procedure: BIOPSY;  Surgeon: Lin Landsman, MD;  Location: [facility];  Service: Endoscopy;  Laterality: N/A;   CESAREAN SECTION     COLONOSCOPY WITH PROPOFOL N/A 05/17/2020   Procedure: COLONOSCOPY WITH PROPOFOL;  Surgeon: Lin Landsman, MD;  Location: [facility];  Service: Endoscopy;  Laterality: N/A;   ESOPHAGOGASTRODUODENOSCOPY (EGD) WITH PROPOFOL N/A 05/17/2020   Procedure: ESOPHAGOGASTRODUODENOSCOPY (EGD) WITH PROPOFOL;  Surgeon: Lin Landsman, MD;  Location: [facility];  Service: Endoscopy;  Laterality: N/A;   TONSILLECTOMY  2005    FAMILY HISTORY: Family  History  Problem Relation Age of Onset   Depression Mother    Irritable bowel syndrome Mother    Basal cell carcinoma Father    Depression Sister    Alzheimer's disease Maternal Grandmother    Colon cancer Maternal Grandmother 68   Alcohol abuse Maternal Grandfather    Suicidality Maternal Grandfather    Emphysema Paternal Grandmother    Crohn's disease Paternal Grandmother    Colon cancer Paternal Grandfather 51   Healthy Daughter    Healthy Daughter    Breast cancer Neg Hx     SOCIAL HISTORY: Social History   Socioeconomic History   Marital status: Married    Spouse name: Ronalee Belts   Number of children: 2   Years of education: Bachelors   Highest education level: Not on file  Occupational History   Not on file  Tobacco Use   Smoking status: Former    Packs/day: 0.25    Years: 5.00    Pack years: 1.25    Types: Cigarettes    Quit date: 01/20/2013    Years since quitting: 7.6   Smokeless tobacco: Never  Vaping Use   Vaping Use: Never used  Substance and Sexual Activity   Alcohol use: Not Currently   Drug use: Never   Sexual activity: Yes    Birth control/protection: None  Other Topics Concern   Not on file  Social History Narrative   Lives at home with husband and kids   Right Handed   Drinks 1 cup daily   Social Determinants of Health   Financial Resource Strain: Not on file  Food Insecurity: Not on file  Transportation Needs: Not on file  Physical Activity: Not on file  Stress: Not on file  Social Connections: Not on file  Intimate Partner Violence: Not on file     Total time spent reviewing the chart, obtaining history, examined patient, ordering tests, documentation, consultations and family, care coordination was 60 minute    Marcial Pacas, M.D. Ph.D.  [facility] [address] [place], [place] [address] Ph: [phone] Fax: [phone]  CC:  Lesleigh Noe, MD [address] [place],  [place] [address]  Lesleigh Noe, MD

## 2020-09-03 ENCOUNTER — Other Ambulatory Visit: Payer: Self-pay

## 2020-09-03 ENCOUNTER — Other Ambulatory Visit: Payer: Self-pay | Admitting: Rheumatology

## 2020-09-03 ENCOUNTER — Ambulatory Visit (INDEPENDENT_AMBULATORY_CARE_PROVIDER_SITE_OTHER): Payer: BC Managed Care – PPO | Admitting: Gastroenterology

## 2020-09-03 ENCOUNTER — Encounter: Payer: Self-pay | Admitting: Gastroenterology

## 2020-09-03 VITALS — BP 143/82 | HR 64 | Temp 98.1°F | Ht 69.0 in | Wt 168.1 lb

## 2020-09-03 DIAGNOSIS — K5080 Crohn's disease of both small and large intestine without complications: Secondary | ICD-10-CM

## 2020-09-03 DIAGNOSIS — H209 Unspecified iridocyclitis: Secondary | ICD-10-CM

## 2020-09-03 DIAGNOSIS — Z23 Encounter for immunization: Secondary | ICD-10-CM | POA: Diagnosis not present

## 2020-09-03 DIAGNOSIS — H3581 Retinal edema: Secondary | ICD-10-CM

## 2020-09-03 DIAGNOSIS — H4710 Unspecified papilledema: Secondary | ICD-10-CM

## 2020-09-03 MED ORDER — PREDNISONE 10 MG PO TABS: ORAL_TABLET | ORAL | 0 refills | Status: AC

## 2020-09-03 MED ORDER — SHINGRIX 50 MCG/0.5ML IM SUSR: 0.5000 mL | Freq: Once | INTRAMUSCULAR | 0 refills | Status: AC

## 2020-09-03 NOTE — Telephone Encounter (Signed)
I have pended orders for all the viruses including HIV and a test for syphilis which we usually order for the screening for uveitis.  Please review with the patient if it is okay to order all these lab tests.

## 2020-09-03 NOTE — Progress Notes (Signed)
Cephas Darby, MD [address]  [place]  [place], [place] [address]  Main: [phone]  Fax: [phone]    Gastroenterology Consultation  Referring Provider:     Lesleigh Noe, MD Primary Care Physician:  Lesleigh Noe, MD Primary Gastroenterologist:  Dr. Cephas Darby Reason for Consultation:    Ileocolonic Crohn's disease        HPI:   Laura Fuentes is a 40 y.o. female referred by Dr. Einar Pheasant, Jobe Marker, MD  for consultation & management of new diagnosis of ileocolonic Crohn's disease.    Follow-up visit 09/03/2020 Patient is here to discuss about immunosuppressive therapy given diagnosis of iridocyclitis, optic nerve swelling and retinal edema and she is worried about permanent blindness.  Patient initially refused to start immunosuppressive therapy.  She was in clinical remission on prednisone.  Patient is currently on prednisone and she has been noticing a flareup of her Crohn's disease.  Crohn's disease classification:  Age: 37 to 15  Location:  ileocolonic  Behavior: non stricturing, non penetrating  Perianal:  no  IBD diagnosis: Ileocolonic Crohn's disease, not stricturing nonpenetrating phenotype biopsy-proven on 05/17/2020  Disease course: Patient has chronic history of lower abdominal pain associated with diarrhea.  Patient reports that her symptoms started on March 13th 2022 with predominantly lower abdominal pain associated with 5-6 episodes of diarrhea mixed with mucus blood-tinged fluid, nausea.  She does report significant arthralgias of large and small joints.  She is also experiencing congestion in her eyes associated with blurred vision, sometimes being in both eyes.  She also started noticing nodules on her bilateral lower legs which are not painful.  She is also developing papular rash on her skin on bilateral upper extremities as well as neck, upper back, nonpruritic, blanching.  She develops chills, denies any fever.  She lost about 10 pounds in  last 2 weeks.  She moved from [place] to [place].   Patient reports that she has seen dermatologist in [place] 1 year ago, she was told that she may had shingles. Work-up thus far revealed significantly elevated CRP 57.54m/L, elevated fecal calprotectin level 786, Giardia antigen test for edema negative, she had elevated IgG Lyme's titers.  She was told that she does not meet the criteria for Lyme's disease.  Normal CBC, CMP, positive ANA with speckled pattern  Patient underwent upper endoscopy as well as colonoscopy on 05/17/2020, colonoscopy confirmed ileitis as well as moderate active chronic colitis.  She was also evaluated by rheumatologist as well as neurologist.  Patient was found to have sacroiliitis, inflammatory arthritis, optic nerve swelling, iridocyclitis of both eyes, retinal edema.  Started on prednisone 40 mg daily on 05/10/2020  Extra intestinal manifestations: Uveitis, iridocyclitis, inflammatory arthritis, sacroiliitis  IBD surgical history: None  Imaging:  MRE none CTE none SBFT none  Procedures:  Colonoscopy 05/17/2020 - Hemorrhoids found on perianal exam. - Aphtha in the terminal ileum. Biopsied. - Simple Endoscopic Score for Crohn's Disease: 23, mucosal inflammatory changes secondary to Crohn's disease, with ileitis and colitis. Biopsied. - Non-bleeding external hemorrhoids.  Upper Endoscopy- Normal duodenal bulb, second portion of the duodenum and examined duodenum. Biopsied. - Normal stomach. Biopsied. - Esophagogastric landmarks identified. - Normal gastroesophageal junction. Biopsied. - Normal esophagus.  DIAGNOSIS:  A. DUODENUM; COLD BIOPSY:  - DUODENAL MUCOSA WITH NO SIGNIFICANT PATHOLOGIC ALTERATION.  - NEGATIVE FOR FEATURES OF CELIAC DISEASE.  - NEGATIVE FOR DYSPLASIA AND MALIGNANCY.   B. STOMACH, RANDOM; COLD BIOPSY:  - ANTRAL  AND OXYNTIC MUCOSA WITH NO SIGNIFICANT PATHOLOGIC ALTERATION.  - NEGATIVE FOR ACTIVE INFLAMMATION AND H PYLORI.   - NEGATIVE FOR INTESTINAL METAPLASIA, DYSPLASIA, AND MALIGNANCY.   C. GEJ; COLD BIOPSY:  - REFLUX GASTROESOPHAGITIS.  - NEGATIVE FOR INTESTINAL METAPLASIA, DYSPLASIA, AND MALIGNANCY.   D. TERMINAL ILEUM; COLD BIOPSY:  - ILEAL MUCOSA WITH NO SIGNIFICANT PATHOLOGIC ALTERATION.  - NEGATIVE FOR ACTIVE INFLAMMATION AND FEATURES OF CHRONICITY.  - NEGATIVE FOR DYSPLASIA AND MALIGNANCY.   E. COLON, RIGHT; COLD BIOPSY:  - CHRONIC COLITIS WITH PATCHY MODERATE ACTIVITY, COMPATIBLE WITH THE  REPORTED HISTORY OF CROHN'S DISEASE.  - NEGATIVE FOR DYSPLASIA AND MALIGNANCY.   F. COLON, LEFT; COLD BIOPSY:  - CHRONIC COLITIS WITH PATCHY MODERATE ACTIVITY, COMPATIBLE WITH THE  REPORTED HISTORY OF CROHN'S DISEASE.  - NEGATIVE FOR DYSPLASIA AND MALIGNANCY.   G. RECTUM; COLD BIOPSY:  - CHRONIC PROCTITIS WITH PATCHY MILD ACTIVITY, COMPATIBLE WITH THE  REPORTED HISTORY OF CROHN'S DISEASE.  - NEGATIVE FOR DYSPLASIA AND MALIGNANCY.   VCE none  IBD medications:  Steroids: Responsive to prednisone, did not try budesonide 5-ASA: Nave mesalamine Immunomodulators: AZA, methotrexate nave TPMT status: heterozygous Biologics: Nave Anti TNFs: Anti Integrins: Ustekinumab: Tofactinib: Clinical trial:    Patient has chronic medical history of lower abdominal pain associated with diarrhea.  Patient reports that her symptoms started on March 13th with predominantly lower abdominal pain associated with 5-6 episodes of diarrhea mixed with mucus blood-tinged fluid, nausea.  She does report significant arthralgias of large and small joints.  She is also experiencing congestion in her eyes associated with blurred vision, sometimes being in both eyes.  She also started noticing nodules on her bilateral lower legs which are not painful.  She is also developing papular rash on her skin on bilateral upper extremities as well as neck, upper back, nonpruritic, blanching.  She develops chills, denies any fever.  She lost  about 10 pounds in last 2 weeks.  She moved from [place] to [place].  Patient reports that she has seen dermatologist in [place] 1 year ago, she was told that she may had shingles. Work-up thus far revealed significantly elevated CRP 57.75m/L, elevated fecal calprotectin level 786, Giardia antigen test for edema negative, she had elevated IgG Lyme's titers.  She was told that she does not meet the criteria for Lyme's disease.  Normal CBC, CMP, positive ANA with speckled pattern  Patient does not smoke or drink alcohol.  She is a wProbation officerby profession   NSAIDs: None  Antiplts/Anticoagulants/Anti thrombotics: None  Family history of IBD  Past Medical History:  Diagnosis Date   BCC (basal cell carcinoma of skin)    COVID-19 01/2020   [place]'s [place]    Elevated fecal calprotectin    History of asthma    History of pre-eclampsia    Uveitis     Past Surgical History:  Procedure Laterality Date   BIOPSY N/A 05/17/2020   Procedure: BIOPSY;  Surgeon: VLin Landsman MD;  [facility]  Service: Endoscopy;  Laterality: N/A;   CESAREAN SECTION     COLONOSCOPY WITH PROPOFOL N/A 05/17/2020   Procedure: COLONOSCOPY WITH PROPOFOL;  Surgeon: VLin Landsman MD;  [facility]  Service: Endoscopy;  Laterality: N/A;   ESOPHAGOGASTRODUODENOSCOPY (EGD) WITH PROPOFOL N/A 05/17/2020   Procedure: ESOPHAGOGASTRODUODENOSCOPY (EGD) WITH PROPOFOL;  Surgeon: VLin Landsman MD;  Location[facility]  Service: Endoscopy;  Laterality: N/A;   TONSILLECTOMY  2005    Current Outpatient  Medications:    ALPRAZolam (XANAX) 1 MG tablet, TAKE 1 TABLET BY MOUTH UP TO ONCE DAILY AS NEEDED FOR ANXIETY, NOT TO EXCEED 20 TABLETS PER MONTH, Disp: , Rfl:    CHLOROPHYLL PO, Take by mouth daily., Disp: , Rfl:    Glutamine 500 MG CAPS, Take by mouth daily., Disp: , Rfl:    MAGNESIUM PO, Take by mouth daily., Disp: , Rfl:    Multiple Vitamin  (MULTIVITAMIN PO), Take by mouth daily., Disp: , Rfl:    OVER THE COUNTER MEDICATION, daily. Gastro-fiber, Disp: , Rfl:    OVER THE COUNTER MEDICATION, daily. Lyme nosode, Disp: , Rfl:    OVER THE COUNTER MEDICATION, daily. Drenatrophin, Disp: , Rfl:    OVER THE COUNTER MEDICATION, daily. CBG and CBD oil, Disp: , Rfl:    OVER THE COUNTER MEDICATION, Biotoxin binder, Disp: , Rfl:    predniSONE (DELTASONE) 10 MG tablet, Take 4 tablets (40 mg total) by mouth daily with breakfast for 7 days, THEN 3 tablets (30 mg total) daily with breakfast for 7 days, THEN 2 tablets (20 mg total) daily with breakfast for 14 days, THEN 1 tablet (10 mg total) daily with breakfast for 14 days., Disp: 91 tablet, Rfl: 0   VITAMIN A PO, Take by mouth daily., Disp: , Rfl:    Zoster Vaccine Adjuvanted (SHINGRIX) injection, Inject 0.5 mLs into the muscle once for 1 dose., Disp: 0.5 mL, Rfl: 0   Cholecalciferol (VITAMIN D3 PO), Take by mouth. With K2 (Patient not taking: Reported on 09/03/2020), Disp: , Rfl:    Family History  Problem Relation Age of Onset   Depression Mother    Irritable bowel syndrome Mother    Basal cell carcinoma Father    Depression Sister    Alzheimer's disease Maternal Grandmother    Colon cancer Maternal Grandmother 74   Alcohol abuse Maternal Grandfather    Suicidality Maternal Grandfather    Emphysema Paternal Grandmother    Crohn's disease Paternal Grandmother    Colon cancer Paternal Grandfather 37   Healthy Daughter    Healthy Daughter    Breast cancer Neg Hx      Social History   Tobacco Use   Smoking status: Former    Packs/day: 0.25    Years: 5.00    Pack years: 1.25    Types: Cigarettes    Quit date: 01/20/2013    Years since quitting: 7.6   Smokeless tobacco: Never  Vaping Use   Vaping Use: Never used  Substance Use Topics   Alcohol use: Yes    Alcohol/week: 1.0 standard drink    Types: 1 Standard drinks or equivalent per week    Comment: one time a week   Drug use:  Never    Allergies as of 09/03/2020   (No Active Allergies)    Review of Systems:    All systems reviewed and negative except where noted in HPI.   Physical Exam:  BP (!) 143/82 (BP Location: Left Arm, Patient Position: Sitting, Cuff Size: Normal)   Pulse 64   Temp 98.1 F (36.7 C) (Oral)   Ht 5' 9"  (1.753 m)   Wt 168 lb 2 oz (76.3 kg)   BMI 24.83 kg/m  No LMP recorded.  General:   Alert,  Well-developed, well-nourished, pleasant and cooperative in NAD Head:  Normocephalic and atraumatic. Eyes:  Sclera clear, no icterus.  Both eyes are congested Ears:  Normal auditory acuity. Nose:  No deformity, discharge, or lesions. Mouth:  No  deformity or lesions,oropharynx pink & moist. Neck:  Supple; no masses or thyromegaly. Lungs:  Respirations even and unlabored.  Clear throughout to auscultation.   No wheezes, crackles, or rhonchi. No acute distress. Heart:  Regular rate and rhythm; no murmurs, clicks, rubs, or gallops. Abdomen:  Normal bowel sounds. Soft, lower abdominal tenderness and non-distended without masses, hepatosplenomegaly or hernias noted.  No guarding or rebound tenderness.   Rectal: Not performed Msk:  Symmetrical without gross deformities. Good, equal movement & strength bilaterally. Pulses:  Normal pulses noted. Extremities:  No clubbing or edema.  No cyanosis. Neurologic:  Alert and oriented x3;  grossly normal neurologically. Skin:  Intact, maculopapular lesions on upper back, bilateral upper extremities, blanching on pressure, nontender nodules in bilateral lower legs, subtle malar rash no jaundice. Psych:  Alert and cooperative. Normal mood and affect.  Imaging Studies: CT abdomen pelvis is ordered  Assessment and Plan:   CARAN STORCK is a 40 y.o. Caucasian female with no significant past medical history is seen in consultation for diagnosis of ileocolonic Crohn's disease, sacroiliitis, inflammatory arthritis, uveitis, iridocyclitis and optic nerve  swelling.    Start prednisone 40 mg daily for 2 weeks followed by quick taper Today, I have discussed with her regarding anti-TNF therapy, tests and benefits, side effects including but not limited to injection site reaction, drug allergy, risk for infection, skin cancer, lymphoma etc. patient is feeling to proceed with Humira if approved by her insurance.  I will also discuss with her neurologist before initiating Humira  IBD Health Maintenance  1.TB status: Negative on 07/11/2020 2. Anemia: None 3.Immunizations: Hep A and B not immune, recommend Twinrix vaccine, received first dose on 09/03/2020, Influenza recommend annual flu vaccine, prevnar administered on 09/03/2020 pneumovax recommended in 3 months, Varicella as a child, Zoster recommend Shingrix vaccine, prescription provided 4.Cancer screening I) Colon cancer/dysplasia surveillance: No evidence of dysplasia, up-to-date II) Cervical cancer: Annual Pap smear up-to-date III) Skin cancer - counseled about annual skin exam by dermatology and skin protection in summer using sun screen SPF > 50, clothing 5.Bone health Vitamin D status: Mildly low, recommend over-the-counter vitamin D supplements daily Bone density testing: Not performed 5. Labs: In 1 month after initiation of Humira, then every 3 months 6. Smoking: None 7. NSAIDs and Antibiotics use: Avoid frequent use  Follow up in 3 months   Cephas Darby, MD

## 2020-09-03 NOTE — Patient Instructions (Addendum)
Please come back in 4 weeks for second Twinrix for a nurse visit.  Shingrix sent to your pharmacy.  Prednisone sent to pharmacy with taper

## 2020-09-03 NOTE — Telephone Encounter (Signed)
Made appointment today at 2:15pm. Called patient and left a detail message informing patient when appointment is

## 2020-09-04 ENCOUNTER — Telehealth: Payer: Self-pay | Admitting: Neurology

## 2020-09-04 ENCOUNTER — Telehealth: Payer: Self-pay

## 2020-09-04 DIAGNOSIS — H4710 Unspecified papilledema: Secondary | ICD-10-CM | POA: Diagnosis not present

## 2020-09-04 DIAGNOSIS — H3581 Retinal edema: Secondary | ICD-10-CM | POA: Diagnosis not present

## 2020-09-04 NOTE — Telephone Encounter (Signed)
Faxed over new start form for Humira to [facility]. Sent Rick a email to inform him the new referral was faxed over.

## 2020-09-04 NOTE — Telephone Encounter (Signed)
I have talked with patient, MRI of the brain from [facility] on June 22, 2020 showed nonspecific findings,  Multiple ophthalmology evaluation from [facility], April 22 2 July 12, 2020, diagnosis of bilateral uveitis, papilledema  From her history, MRI findings, no evidence of intracranial involvement, but from the record of ophthalmology evaluation, do think her abnormal eye findings, retinal edema, optic nerve swelling, uveitis of both eye are consistent with her systemic inflammatory process, at most recent ophthalmology evaluation by Dr. Manuella Ghazi August 21, 2020, he has suggested continued follow-up, and suggested immunosuppressive treatment

## 2020-09-05 ENCOUNTER — Telehealth: Payer: Self-pay

## 2020-09-05 DIAGNOSIS — F410 Panic disorder [episodic paroxysmal anxiety] without agoraphobia: Secondary | ICD-10-CM | POA: Diagnosis not present

## 2020-09-05 DIAGNOSIS — F4323 Adjustment disorder with mixed anxiety and depressed mood: Secondary | ICD-10-CM | POA: Diagnosis not present

## 2020-09-05 NOTE — Telephone Encounter (Signed)
Per Liliane Channel with [employer] fixed the problem and we will resubmit PA to [facility].

## 2020-09-05 NOTE — Telephone Encounter (Signed)
Got a fax from [employer] stating they do not handle the patient prior authorizations.   They said to call the plan specific at [phone]. Emailed Sharrie Rothman with [facility] to see if they were going to call to do prior authorization or do I need to call. She states  they will probably call but she will check into it.

## 2020-09-06 ENCOUNTER — Encounter: Payer: Self-pay | Admitting: Rheumatology

## 2020-09-06 DIAGNOSIS — R7989 Other specified abnormal findings of blood chemistry: Secondary | ICD-10-CM

## 2020-09-06 LAB — EPSTEIN-BARR VIRUS (EBV) ANTIBODY PROFILE
EBV NA IgG: 32.7 U/mL — ABNORMAL HIGH (ref 0.0–17.9)
EBV VCA IgG: 355 U/mL — ABNORMAL HIGH (ref 0.0–17.9)
EBV VCA IgM: <36 U/mL (ref 0.0–35.9)

## 2020-09-06 LAB — CMV ABS, IGG+IGM (CYTOMEGALOVIRUS)
CMV Ab - IgG: >10 U/mL — ABNORMAL HIGH (ref 0.00–0.59)
CMV IgM Ser EIA-aCnc: <30 AU/mL (ref 0.0–29.9)

## 2020-09-06 LAB — FLUORESCENT TREPONEMAL AB(FTA)-IGG-BLD: Fluorescent Treponemal Ab, IgG: NONREACTIVE

## 2020-09-06 LAB — VARICELLA ZOSTER ABS, IGG/IGM
Varicella IgM: <0.91 index (ref 0.00–0.90)
Varicella zoster IgG: 552 index (ref >165)

## 2020-09-06 LAB — INFECT DISEASE AB IGM REFLEX 1

## 2020-09-06 LAB — HSV(HERPES SIMPLEX VRS) I + II AB-IGG
HSV 1 Glycoprotein G Ab, IgG: 14.1 index — ABNORMAL HIGH (ref 0.00–0.90)
HSV 2 IgG, Type Spec: 2.86 index — ABNORMAL HIGH (ref 0.00–0.90)

## 2020-09-06 LAB — TOXOPLASMA GONDII ANTIBODY, IGM: Toxoplasma Antibody- IgM: 3.5 AU/mL (ref 0.0–7.9)

## 2020-09-06 LAB — HSV-2 IGG SUPPLEMENTAL TEST: HSV-2 IgG Supplemental Test: POSITIVE — AB

## 2020-09-06 NOTE — Progress Notes (Signed)
Varicella negative, toxoplasmosis negative, HSV-1 and HSV-2 past infection, FTA antibody negative, EBV past infection, CMV past infection. Serology does not indicate any recent infections.

## 2020-09-06 NOTE — Telephone Encounter (Signed)
[employer] called this morning and states the wrong prior authorization form was submitted on this patient. Did prior authorization over the phone. She states she will forward this over to the pharmacist so they will decide if they will cover the medication.

## 2020-09-07 ENCOUNTER — Encounter: Payer: Self-pay | Admitting: Neurology

## 2020-09-10 DIAGNOSIS — F4322 Adjustment disorder with anxiety: Secondary | ICD-10-CM | POA: Diagnosis not present

## 2020-09-10 NOTE — Telephone Encounter (Signed)
I had a detailed discussion with the patient regarding serology results.  She would like an appointment within an  infectious disease doctor to discuss the serology results.  Please make a referral to infectious disease.

## 2020-09-12 ENCOUNTER — Ambulatory Visit: Payer: BC Managed Care – PPO | Admitting: Rheumatology

## 2020-09-12 ENCOUNTER — Telehealth: Payer: Self-pay

## 2020-09-12 NOTE — Telephone Encounter (Signed)
Per Liliane Channel with [employer] [facility] he states I see patient [name] 1980/09/16 is approved and we are reaching out to her for shipment.  If you may speak to her, she can call us [phone] for shipment. :) thank you so much!  We will continue to reach out to her. :) Sent patient a mychart message also

## 2020-09-13 DIAGNOSIS — F4323 Adjustment disorder with mixed anxiety and depressed mood: Secondary | ICD-10-CM | POA: Diagnosis not present

## 2020-09-13 DIAGNOSIS — F410 Panic disorder [episodic paroxysmal anxiety] without agoraphobia: Secondary | ICD-10-CM | POA: Diagnosis not present

## 2020-09-13 NOTE — Telephone Encounter (Signed)
Informed rick with [employer] [facility] of the patient request

## 2020-09-19 DIAGNOSIS — F4322 Adjustment disorder with anxiety: Secondary | ICD-10-CM | POA: Diagnosis not present

## 2020-09-25 ENCOUNTER — Other Ambulatory Visit: Payer: Self-pay

## 2020-09-25 ENCOUNTER — Other Ambulatory Visit
Admission: RE | Admit: 2020-09-25 | Discharge: 2020-09-25 | Disposition: A | Discharge status: Home / Self Care | Payer: BC Managed Care – PPO | Attending: Infectious Diseases | Admitting: Infectious Diseases

## 2020-09-25 ENCOUNTER — Ambulatory Visit: Payer: BC Managed Care – PPO | Attending: Infectious Diseases | Admitting: Infectious Diseases

## 2020-09-25 VITALS — BP 122/88 | HR 85 | Temp 98.5°F | Resp 16 | Ht 69.0 in | Wt 155.5 lb

## 2020-09-25 DIAGNOSIS — Z8616 Personal history of COVID-19: Secondary | ICD-10-CM | POA: Insufficient documentation

## 2020-09-25 DIAGNOSIS — R7989 Other specified abnormal findings of blood chemistry: Secondary | ICD-10-CM | POA: Diagnosis not present

## 2020-09-25 DIAGNOSIS — B009 Herpesviral infection, unspecified: Secondary | ICD-10-CM | POA: Insufficient documentation

## 2020-09-25 DIAGNOSIS — K5090 Crohn's disease, unspecified, without complications: Secondary | ICD-10-CM | POA: Insufficient documentation

## 2020-09-25 DIAGNOSIS — Z87891 Personal history of nicotine dependence: Secondary | ICD-10-CM | POA: Diagnosis not present

## 2020-09-25 DIAGNOSIS — H209 Unspecified iridocyclitis: Secondary | ICD-10-CM | POA: Diagnosis not present

## 2020-09-25 NOTE — Progress Notes (Signed)
NAME: Laura Fuentes  DOB: 12-27-1980  MRN: [id number]  Date/Time: 09/25/2020 10:42 AM  REQUESTING PROVIDER: Dr.Deweshwar Subjective:  REASON FOR CONSULT: pt with crohns disease, with increase in EBV, CMV, HSV IgG  ? [name] is a 40 y.o. female is here today to discuss the high IgG antibodies to EBV and HSV and also a rash she has had intermittently for the past year or so Pt at baseline was in good health until 2019. She delivered twins in  April 2019 by csection at 35 weeks for severe preeclampsia and fetal intolerance. She had some anxiety and depression following that.   In 2018 she had a tattoo on her rt arm and had second thoughts about it and started the removal process with laser- She got a session or so and stopped as she became pregnant  A few months after her delivery she started having random rash on her arms and face and thought it was either related to sun, or rubbing on plants.These got worse after she got her covid vaccine April 2021 (Laura Fuentes) In Aug 2021 when she was in [place] she had  a non specific skin eruption over rt thigh, leg, shoulder and did a video call with her PCP. She was given some anti itch medicine with concern for an allergic reaction to possible Insect bite. She restarted the tattoo removal process and had a few more sessions of laser therapy to complete 10  in Jan 2022.In April 2022 she started having lower abdominal pain and was feeling bloated. As there was h/o crohns disease in grandparents and family H/o colon ca her pcp did fecal calprotectin and FOB and as it was positive she was referred to GI- saw Dr.Vanga and underwent colonoscopy and upper endoscopy. Colonoscopy showed ulcers in ileum and colon with Simple Endoscopic Score for Crohn's Disease: 23, mucosal inflammatory changes secondary to Crohn's disease,with ileitis and colitis. Biopsy showed chronic colitis consistent with crohns PT was recommended [facility] Meanwhile she has been seeing ophthalmologist for  a diagnosis of Uveitis and has received steroid drops- Also saw Rheumatologist Dr.Deveshwar on 07/11/20 for pain in her joints including SI , rt knee pain with small effusion which resolved, some stiffness in her joints/ She had normal Autoimmune work upexcept ANA 1:40 and ANCA 1:40 deemed not significant. Lyme western blot was done directly and showed 4 reactive bands.  She also has been seeing a functional medicine physciain and on supplements Dr.Brasington was concerned about papliiedema and ordered MRI brain which showed   She saw Dr. Krista Blue neurologist . Some more infectious disease work up was done including FTA/ TOXO IgGM. HSV, CMV, EBV , VZV antibodies. FTA and TOXO negative Herpes group virus antibodies positive for IgG but not IgM I am seeing her because she wants to know whether any of the herpes group of virus is causing her current problems Also patient currently has some lesions on her face and lumbar area and wants to know whether this is herpes Past Medical History:  Diagnosis Date   BCC (basal cell carcinoma of skin)    COVID-19 01/2020   Crohn's disease ([place])    Elevated fecal calprotectin    History of asthma    History of pre-eclampsia    Uveitis     Past Surgical History:  Procedure Laterality Date   BIOPSY N/A 05/17/2020   Procedure: BIOPSY;  Surgeon: Lin Landsman, MD;  Location: [place];  Service: Endoscopy;  Laterality: N/A;   CESAREAN  SECTION     COLONOSCOPY WITH PROPOFOL N/A 05/17/2020   Procedure: COLONOSCOPY WITH PROPOFOL;  Surgeon: Lin Landsman, MD;  Location: [place];  Service: Endoscopy;  Laterality: N/A;   ESOPHAGOGASTRODUODENOSCOPY (EGD) WITH PROPOFOL N/A 05/17/2020   Procedure: ESOPHAGOGASTRODUODENOSCOPY (EGD) WITH PROPOFOL;  Surgeon: Lin Landsman, MD;  Location: [place];  Service: Endoscopy;  Laterality: N/A;   TONSILLECTOMY  2005    Social History   Socioeconomic History   Marital status: Married     Spouse name: Laura Fuentes   Number of children: 2   Years of education: Bachelors   Highest education level: Not on file  Occupational History   Not on file  Tobacco Use   Smoking status: Former    Packs/day: 0.25    Years: 5.00    Pack years: 1.25    Types: Cigarettes    Quit date: 01/20/2013    Years since quitting: 7.6   Smokeless tobacco: Never  Vaping Use   Vaping Use: Never used  Substance and Sexual Activity   Alcohol use: Yes    Alcohol/week: 1.0 standard drink    Types: 1 Standard drinks or equivalent per week    Comment: one time a week   Drug use: Never   Sexual activity: Yes    Birth control/protection: None  Other Topics Concern   Not on file  Social History Narrative   Lives at home with husband and kids   Right Handed   Drinks 1 cup daily   Social Determinants of Health   Financial Resource Strain: Not on file  Food Insecurity: Not on file  Transportation Needs: Not on file  Physical Activity: Not on file  Stress: Not on file  Social Connections: Not on file  Intimate Partner Violence: Not on file    Family History  Problem Relation Age of Onset   Depression Mother    Irritable bowel syndrome Mother    Basal cell carcinoma Father    Depression Sister    Alzheimer's disease Maternal Grandmother    Colon cancer Maternal Grandmother 40   Alcohol abuse Maternal Grandfather    Suicidality Maternal Grandfather    Emphysema Paternal Grandmother    Crohn's disease Paternal Grandmother    Colon cancer Paternal Grandfather 6   Healthy Daughter    Healthy Daughter    Breast cancer Neg Hx    No Known Allergies I? Current Outpatient Medications  Medication Sig Dispense Refill   ALPRAZolam (XANAX) 1 MG tablet TAKE 1 TABLET BY MOUTH UP TO ONCE DAILY AS NEEDED FOR ANXIETY, NOT TO EXCEED 20 TABLETS PER MONTH     CHLOROPHYLL PO Take by mouth daily.     Cholecalciferol (VITAMIN D3 PO) Take by mouth. With K2     Glutamine 500 MG CAPS Take by mouth daily.      MAGNESIUM PO Take by mouth daily.     Multiple Vitamin (MULTIVITAMIN PO) Take by mouth daily.     OVER THE COUNTER MEDICATION daily. Gastro-fiber     OVER THE COUNTER MEDICATION daily. Lyme nosode     OVER THE COUNTER MEDICATION daily. Drenatrophin     OVER THE COUNTER MEDICATION daily. CBG and CBD oil     OVER THE COUNTER MEDICATION Biotoxin binder     predniSONE (DELTASONE) 10 MG tablet Take 4 tablets (40 mg total) by mouth daily with breakfast for 7 days, THEN 3 tablets (30 mg total) daily with breakfast for 7 days, THEN 2 tablets (20  mg total) daily with breakfast for 14 days, THEN 1 tablet (10 mg total) daily with breakfast for 14 days. 91 tablet 0   VITAMIN A PO Take by mouth daily.     No current facility-administered medications for this visit.     Abtx:  Anti-infectives (From admission, onward)    None       REVIEW OF SYSTEMS:  Const: negative fever, negative chills,  weight loss of 20 pounds Eyes: h/o rednesss eyes, blurring ENT: negative coryza, negative sore throat Resp: negative cough, hemoptysis, dyspnea Cards: negative for chest pain, palpitations, lower extremity edema GU: negative for frequency, dysuria and hematuria GI: positive for  abdominal pain, diarrhea,  Skin: intermittent rash Heme: negative for easy bruising and gum/nose bleeding MS: negative for myalgias, arthralgias, back pain and muscle weakness Neurolo:negative for headaches, dizziness, vertigo, memory problems  Psych: h/o  anxiety, depression  Endocrine: negative for thyroid, diabetes Allergy/Immunology- negative for any medication or food allergies- but she has a diet that is lactose and gluten free ? Objective:  VITALS:  BP 122/88   Pulse 85   Temp 98.5 F (36.9 C) (Oral)   Resp 16   Ht 5' 9"  (1.753 m)   Wt 155 lb 8 oz (70.5 kg)   LMP 09/13/2020 Comment: Has had 2 periods in August  SpO2 99%   BMI 22.96 kg/m  PHYSICAL EXAM:  General: Alert, cooperative, no distress, appears stated  age.  Head: Normocephalic, without obvious abnormality, atraumatic. Eyes: Conjunctivae clear, anicteric sclerae. Pupils are equal ENT Nares normal. No drainage or sinus tenderness. Lips, mucosa, and tongue normal. No Thrush Neck: Supple, symmetrical, no adenopathy, thyroid: non tender no carotid bruit and no JVD. Back: No CVA tenderness. Lungs: Clear to auscultation bilaterally. No Wheezing or Rhonchi. No rales. Heart: Regular rate and rhythm, no murmur, rub or gallop. Abdomen: Soft, non-tender,not distended. Bowel sounds normal. No masses Extremities: atraumatic, no cyanosis. No edema. No clubbing Skin: papulo nodular eruption on left side of face and a few on the neck and lumbar area     Lymph: Cervical, supraclavicular normal. Neurologic: Grossly non-focal Pertinent Labs    Impression/Recommendation  1) Constellation of symptoms and signs- colitis, ileitis, uveitis,papilledema,  arthralgia, rt knee small effusion and an intermittent rash- Do they all fit under the umbrella of Crohns? Infectious work up for syphilis, toxo, Tb negative by lab work- The correct lyme test was not ordered- EIA reflexed to WB is the test to be reodered as a direct WB can give false positive result  2) Papular  /eruption on the left side of  face and neck and a couple on the lumbar region- uncelar etiology- sent swab for HSV/VZV PCR May need biopsy- recommend dermatologist  3) High IgG tires for CMV, EBV, HSV, VZV- all of which indicates prior infection- I cannot say that these are all related to her underlying pathology and there is a correlation. The IgG can  increase with underlying inflammation and also can after vaccinations.  She wanted to repeat the labs as she thought on 8/16 when it was done she was having a colitis flare There is no indication to say that she has an active EBV or CMV infection-  Will check lyme antibodies with reflex to WB  Discussed with her in detail Will let her know of  the test results

## 2020-09-25 NOTE — Patient Instructions (Addendum)
You are here to discuss the meaning of the antibodies to CMV.EBV. HSV and connection to the diagnosis of crohns- also you have a rash and want to know whether it is HSV- today I did a PCR test for the skin lesion and repeating CMV. EBV /Lyme antibodies

## 2020-09-26 DIAGNOSIS — F4322 Adjustment disorder with anxiety: Secondary | ICD-10-CM | POA: Diagnosis not present

## 2020-09-26 LAB — EPSTEIN-BARR VIRUS (EBV) ANTIBODY PROFILE
EBV NA IgG: 29.4 U/mL — ABNORMAL HIGH (ref 0.0–17.9)
EBV VCA IgG: 224 U/mL — ABNORMAL HIGH (ref 0.0–17.9)
EBV VCA IgM: <36 U/mL (ref 0.0–35.9)

## 2020-09-26 LAB — CMV ANTIBODY, IGG (EIA): CMV Ab - IgG: >10 U/mL — ABNORMAL HIGH (ref 0.00–0.59)

## 2020-09-26 LAB — CMV IGM: CMV IgM: <30 AU/mL (ref 0.0–29.9)

## 2020-09-27 DIAGNOSIS — L309 Dermatitis, unspecified: Secondary | ICD-10-CM | POA: Diagnosis not present

## 2020-09-29 LAB — HSV AND VZV PCR PANEL
HSV 1 DNA: NEGATIVE
HSV 1 DNA: NEGATIVE
HSV 2 DNA: NEGATIVE
HSV 2 DNA: NEGATIVE
Varicella-Zoster, PCR: NEGATIVE
Varicella-Zoster, PCR: NEGATIVE

## 2020-10-03 LAB — LYME DISEASE, WESTERN BLOT
IgG P18 Ab.: ABSENT
IgG P23 Ab.: ABSENT
IgG P28 Ab.: ABSENT
IgG P30 Ab.: ABSENT
IgG P39 Ab.: ABSENT
IgG P45 Ab.: ABSENT
IgG P58 Ab.: ABSENT
IgG P66 Ab.: ABSENT
IgG P93 Ab.: ABSENT
IgM P23 Ab.: ABSENT
IgM P39 Ab.: ABSENT
IgM P41 Ab.: ABSENT
Lyme IgG Wb: NEGATIVE
Lyme IgM Wb: NEGATIVE

## 2020-10-04 ENCOUNTER — Other Ambulatory Visit: Payer: Self-pay | Admitting: Infectious Diseases

## 2020-10-05 LAB — LYME DISEASE SEROLOGY W/REFLEX: Lyme Total Antibody EIA: NEGATIVE

## 2020-10-09 ENCOUNTER — Telehealth: Payer: Self-pay

## 2020-10-09 DIAGNOSIS — F4322 Adjustment disorder with anxiety: Secondary | ICD-10-CM | POA: Diagnosis not present

## 2020-10-09 NOTE — Telephone Encounter (Signed)
Left vm stating labs are all negative for lyme, herpes, and EBV/CMV viruses. Patient can call if needs anything but we do not need to follow up at this time. I did also send this in mychart.

## 2020-10-10 ENCOUNTER — Ambulatory Visit: Payer: BC Managed Care – PPO

## 2020-10-10 ENCOUNTER — Telehealth: Payer: Self-pay | Admitting: Gastroenterology

## 2020-10-10 NOTE — Telephone Encounter (Signed)
Pt.requesting call back to see if she is having a vaccine she can not remember which one it is.

## 2020-10-10 NOTE — Telephone Encounter (Signed)
Yes patient is schedule for the 2nd Hepatitis A and B vaccine. She is schedule for today. She asked if we could reschedule it to next week. Moved appointment

## 2020-10-11 ENCOUNTER — Ambulatory Visit: Payer: BC Managed Care – PPO | Admitting: Neurology

## 2020-10-15 ENCOUNTER — Telehealth: Payer: Self-pay | Admitting: Family Medicine

## 2020-10-15 DIAGNOSIS — H3581 Retinal edema: Secondary | ICD-10-CM | POA: Diagnosis not present

## 2020-10-15 DIAGNOSIS — H30033 Focal chorioretinal inflammation, peripheral, bilateral: Secondary | ICD-10-CM | POA: Diagnosis not present

## 2020-10-15 DIAGNOSIS — H4710 Unspecified papilledema: Secondary | ICD-10-CM | POA: Diagnosis not present

## 2020-10-15 NOTE — Telephone Encounter (Signed)
Pt is wondering if you would be willing to take her husband on as a patient. He needs a PCP and has been unable to find anywhere taking new patients. Please advise.

## 2020-10-15 NOTE — Telephone Encounter (Signed)
That would be fine. He can schedule in any opening for new patient

## 2020-10-16 DIAGNOSIS — F4322 Adjustment disorder with anxiety: Secondary | ICD-10-CM | POA: Diagnosis not present

## 2020-10-16 NOTE — Telephone Encounter (Signed)
Called Laura Fuentes and got him scheduled for 10/6 at 80

## 2020-10-17 ENCOUNTER — Ambulatory Visit: Payer: BC Managed Care – PPO

## 2020-10-22 ENCOUNTER — Other Ambulatory Visit: Payer: Self-pay

## 2020-10-22 ENCOUNTER — Ambulatory Visit (INDEPENDENT_AMBULATORY_CARE_PROVIDER_SITE_OTHER): Payer: BC Managed Care – PPO | Admitting: Family Medicine

## 2020-10-22 ENCOUNTER — Encounter: Payer: Self-pay | Admitting: Family Medicine

## 2020-10-22 ENCOUNTER — Other Ambulatory Visit (HOSPITAL_COMMUNITY)
Admission: RE | Admit: 2020-10-22 | Discharge: 2020-10-22 | Disposition: A | Discharge status: Home / Self Care | Payer: BC Managed Care – PPO | Source: Ambulatory Visit | Attending: Family Medicine | Admitting: Family Medicine

## 2020-10-22 VITALS — BP 124/81 | HR 80 | Ht 69.0 in | Wt 152.0 lb

## 2020-10-22 DIAGNOSIS — N921 Excessive and frequent menstruation with irregular cycle: Secondary | ICD-10-CM

## 2020-10-22 DIAGNOSIS — Z01419 Encounter for gynecological examination (general) (routine) without abnormal findings: Secondary | ICD-10-CM | POA: Diagnosis not present

## 2020-10-22 DIAGNOSIS — M549 Dorsalgia, unspecified: Secondary | ICD-10-CM | POA: Diagnosis not present

## 2020-10-22 DIAGNOSIS — N926 Irregular menstruation, unspecified: Secondary | ICD-10-CM | POA: Diagnosis not present

## 2020-10-22 LAB — CBC
Hematocrit: 41.6 % (ref 34.0–46.6)
Hemoglobin: 13.4 g/dL (ref 11.1–15.9)
MCH: 28.4 pg (ref 26.6–33.0)
MCHC: 32.2 g/dL (ref 31.5–35.7)
MCV: 88 fL (ref 79–97)
Platelets: 381 10*3/uL (ref 150–450)
RBC: 4.72 x10E6/uL (ref 3.77–5.28)
RDW: 12.5 % (ref 11.7–15.4)
WBC: 8.1 10*3/uL (ref 3.4–10.8)

## 2020-10-22 LAB — POCT URINALYSIS DIPSTICK
Bilirubin, UA: NEGATIVE
Blood, UA: NEGATIVE
Glucose, UA: NEGATIVE
Ketones, UA: NEGATIVE
Leukocytes, UA: NEGATIVE
Nitrite, UA: NEGATIVE
Protein, UA: NEGATIVE
Spec Grav, UA: 1.015 (ref 1.010–1.025)
Urobilinogen, UA: 0.2 E.U./dL
pH, UA: 6.5 (ref 5.0–8.0)

## 2020-10-22 LAB — HEMOGLOBIN A1C
Est. average glucose Bld gHb Est-mCnc: 123 mg/dL
Hgb A1c MFr Bld: 5.9 % — ABNORMAL HIGH (ref 4.8–5.6)

## 2020-10-22 NOTE — Progress Notes (Addendum)
GYNECOLOGY CLINIC ANNUAL PREVENTATIVE CARE ENCOUNTER NOTE  Subjective:   Laura Fuentes is a 40 y.o. [id number] female here for a routine annual gynecologic exam.  Current complaints: irregular menstrual cycles. Patient states that she was diagnosed with Crohn's disease in April 2022. Before that time, her periods were painful and accompanied by fatigue and mood symptoms. Since April, however, she has not experienced these symptoms as often. Her periods were regular and of normal quality as well up until the last three months where they have been 18 weeks, 18 weeks, and 23 weeks in duration. She denies pain or heavy bleeding with these periods. She also denies discharge, urinary changes, problems with intercourse, or other gynecologic concerns. She does experience some lower abdominal pain at times that feels similar to her Crohn's flares.   Gynecologic History No LMP recorded. Contraception: none Last Pap: Dec 2019. Results were: normal  Obstetric History OB History  Gravida Para Term Preterm AB Living  1 1   1   2   SAB IAB Ectopic Multiple Live Births          2    # Outcome Date GA Lbr Len/2nd Weight Sex Delivery Anes PTL Lv  1 Preterm 05/01/17     CS-LTranv   LIV    Past Medical History:  Diagnosis Date   BCC (basal cell carcinoma of skin)    COVID-19 01/2020   Crohn's disease ([place])    Elevated fecal calprotectin    History of asthma    History of pre-eclampsia    Uveitis     Past Surgical History:  Procedure Laterality Date   BIOPSY N/A 05/17/2020   Procedure: BIOPSY;  Surgeon: Lin Landsman, MD;  Location: [facility];  Service: Endoscopy;  Laterality: N/A;   CESAREAN SECTION     COLONOSCOPY WITH PROPOFOL N/A 05/17/2020   Procedure: COLONOSCOPY WITH PROPOFOL;  Surgeon: Lin Landsman, MD;  Location: [facility];  Service: Endoscopy;  Laterality: N/A;   ESOPHAGOGASTRODUODENOSCOPY (EGD) WITH PROPOFOL N/A 05/17/2020   Procedure:  ESOPHAGOGASTRODUODENOSCOPY (EGD) WITH PROPOFOL;  Surgeon: Lin Landsman, MD;  Location: [facility];  Service: Endoscopy;  Laterality: N/A;   TONSILLECTOMY  2005    Current Outpatient Medications on File Prior to Visit  Medication Sig Dispense Refill   ALPRAZolam (XANAX) 1 MG tablet TAKE 1 TABLET BY MOUTH UP TO ONCE DAILY AS NEEDED FOR ANXIETY, NOT TO EXCEED 20 TABLETS PER MONTH     CHLOROPHYLL PO Take by mouth daily.     Cholecalciferol (VITAMIN D3 PO) Take by mouth. With K2     Glutamine 500 MG CAPS Take by mouth daily.     MAGNESIUM PO Take by mouth daily.     Multiple Vitamin (MULTIVITAMIN PO) Take by mouth daily.     OVER THE COUNTER MEDICATION daily. Gastro-fiber     OVER THE COUNTER MEDICATION daily. Lyme nosode     OVER THE COUNTER MEDICATION daily. Drenatrophin     OVER THE COUNTER MEDICATION daily. CBG and CBD oil     OVER THE COUNTER MEDICATION Biotoxin binder     VITAMIN A PO Take by mouth daily.     No current facility-administered medications on file prior to visit.    No Known Allergies  Social History   Socioeconomic History   Marital status: Married    Spouse name: Ronalee Belts   Number of children: 2   Years of education: Bachelors   Highest education level: Not  on file  Occupational History   Not on file  Tobacco Use   Smoking status: Former    Packs/day: 0.25    Years: 5.00    Pack years: 1.25    Types: Cigarettes    Quit date: 01/20/2013    Years since quitting: 7.7   Smokeless tobacco: Never  Vaping Use   Vaping Use: Never used  Substance and Sexual Activity   Alcohol use: Not Currently    Alcohol/week: 1.0 standard drink    Types: 1 Standard drinks or equivalent per week   Drug use: Never   Sexual activity: Yes    Birth control/protection: None  Other Topics Concern   Not on file  Social History Narrative   Lives at home with husband and kids   Right Handed   Drinks 1 cup daily   Social Determinants of Health   Financial Resource  Strain: Not on file  Food Insecurity: Not on file  Transportation Needs: Not on file  Physical Activity: Not on file  Stress: Not on file  Social Connections: Not on file  Intimate Partner Violence: Not on file    Family History  Problem Relation Age of Onset   Depression Mother    Irritable bowel syndrome Mother    Basal cell carcinoma Father    Depression Sister    Alzheimer's disease Maternal Grandmother    Colon cancer Maternal Grandmother 26   Alcohol abuse Maternal Grandfather    Suicidality Maternal Grandfather    Emphysema Paternal Grandmother    Crohn's disease Paternal Grandmother    Colon cancer Paternal Grandfather 65   Healthy Daughter    Healthy Daughter    Breast cancer Neg Hx     The following portions of the patient's history were reviewed and updated as appropriate: allergies, current medications, past family history, past medical history, past social history, past surgical history and problem list.  Review of Systems Pertinent items are noted in HPI.   Objective:  BP 124/81   Pulse 80   Ht 5' 9"  (1.753 m)   Wt 152 lb (68.9 kg)   BMI 22.45 kg/m   CONSTITUTIONAL: Well-developed, well-nourished female in no acute distress.  HENT:  Normocephalic, atraumatic EYES: Conjunctivae and EOM are grossly normal. No scleral icterus.  NECK: Grossly normal range of motion. SKIN: Skin is warm and dry. No rash noted. Not diaphoretic. No erythema. No pallor. [place]: Alert and oriented to person, place, and time. Normal gross muscle tone coordination. No gross cranial nerve deficit noted. PSYCHIATRIC: Normal mood and affect. Normal behavior. Normal judgment and thought content. CARDIOVASCULAR: Normal heart rate noted. RESPIRATORY: Effort normal, no problems with respiration noted. BREASTS: Symmetric in size. No masses, skin changes, nipple drainage, or lymphadenopathy. ABDOMEN: No distention noted. PELVIC: Normal appearing external genitalia; normal appearing  vaginal mucosa and cervix.  No abnormal discharge noted.  Pap smear obtained.  Normal uterine size, no other palpable masses, no uterine or adnexal tenderness. MUSCULOSKELETAL: Grossly normal range of motion. No cyanosis, clubbing, or edema.  Assessment:  Annual gynecologic examination with pap smear Evaluation of irregular menstrual cycles   Plan:   Irregular menstrual cycles Patient is overall doing well and is in NAD. She is amenable to obtaining metabolic labs to assess etiology of irregular bleeding. Also discussed how hormonal levels have little utility in predicting menopause. Discussed potential need for [place] if labs return normal and symptoms persist over time. Patient is amenable to plan and will follow up accordingly. -TSH to  assess hypothyroidism -Hgb A1c to assess DM -CBC to assess infection, anemia -UA to assess DM, infection -Testosterone level to assess PCOS, though lower on differential given symptoms -Follow up labs and any improvement in 3 months  Health maintenance Will follow up results of pap smear and manage accordingly. Mammogram scheduled after shared decision making Routine preventative health maintenance measures emphasized.  Please refer to After Visit Summary for other counseling recommendations.   Mikal Plane, MS3   Attestation of Supervision of Student:  I confirm that I have verified the information documented in the medical student's note and that I have also personally reperformed the history, physical exam and all medical decision making activities.  I have verified that all services and findings are accurately documented in this student's note; and I agree with management and plan as outlined in the documentation. I have also made any necessary editorial changes.  I confirmed and obtained a history and I personally performed the physical exam at the patient's request.   Caren Macadam, [place] for [facility], [place]  [facility] 10/26/2020 2:25 PM

## 2020-10-22 NOTE — Progress Notes (Signed)
Patient presents for Annual Exam today. Last annual exam was 01/19/2019.  Last pap:12/23/2017 WNL  LMP:10/05/20 cycles usually last 4 days w/ heavy flow first few days then moderate.   STD Screening: Declines  Family Hx of Breast Cancer: MGM  Contraception: None at this time Mammogram: Never. (will be 40 yrs old next month).   CC: Irregular periods. Pt notes Hx of painful periods however  up until April not as painful. Also, c/o back pain, no dysuria , no burning w/ urination and no odor.  Dx w/ Chron's Disease in April  *Pt consents to student in exam room.

## 2020-10-23 DIAGNOSIS — F4323 Adjustment disorder with mixed anxiety and depressed mood: Secondary | ICD-10-CM | POA: Diagnosis not present

## 2020-10-23 LAB — TSH: TSH: 1.51 u[IU]/mL (ref 0.450–4.500)

## 2020-10-24 LAB — TESTT+TESTF+SHBG
Sex Hormone Binding: 112 nmol/L (ref 24.6–122.0)
Testosterone, Free: 0.6 pg/mL (ref 0.0–4.2)
Testosterone, Total, LC/MS: 15.7 ng/dL (ref 10.0–55.0)

## 2020-10-25 LAB — CYTOLOGY - PAP
Adequacy: ABSENT
Comment: NEGATIVE
Diagnosis: NEGATIVE
High risk HPV: NEGATIVE

## 2020-10-26 ENCOUNTER — Encounter: Payer: Self-pay | Admitting: Family Medicine

## 2020-10-30 DIAGNOSIS — F4323 Adjustment disorder with mixed anxiety and depressed mood: Secondary | ICD-10-CM | POA: Diagnosis not present

## 2020-11-12 ENCOUNTER — Telehealth: Payer: Self-pay

## 2020-11-12 NOTE — Telephone Encounter (Signed)
Left message with pt to cal to get mammo scheduled

## 2020-11-14 DIAGNOSIS — F4323 Adjustment disorder with mixed anxiety and depressed mood: Secondary | ICD-10-CM | POA: Diagnosis not present

## 2020-11-20 DIAGNOSIS — K50819 Crohn's disease of both small and large intestine with unspecified complications: Secondary | ICD-10-CM | POA: Diagnosis not present

## 2020-11-21 DIAGNOSIS — F4323 Adjustment disorder with mixed anxiety and depressed mood: Secondary | ICD-10-CM | POA: Diagnosis not present

## 2020-11-21 DIAGNOSIS — F410 Panic disorder [episodic paroxysmal anxiety] without agoraphobia: Secondary | ICD-10-CM | POA: Diagnosis not present

## 2020-11-28 ENCOUNTER — Encounter: Payer: Self-pay | Admitting: Gastroenterology

## 2020-12-02 ENCOUNTER — Other Ambulatory Visit
Admission: RE | Admit: 2020-12-02 | Discharge: 2020-12-02 | Disposition: A | Discharge status: Home / Self Care | Payer: BC Managed Care – PPO | Attending: Gastroenterology | Admitting: Gastroenterology

## 2020-12-02 DIAGNOSIS — K5080 Crohn's disease of both small and large intestine without complications: Secondary | ICD-10-CM | POA: Diagnosis not present

## 2020-12-03 LAB — CALPROTECTIN, FECAL: Calprotectin, Fecal: 18 ug/g (ref 0–120)

## 2020-12-04 ENCOUNTER — Ambulatory Visit: Payer: BC Managed Care – PPO | Admitting: Gastroenterology

## 2021-01-02 DIAGNOSIS — F410 Panic disorder [episodic paroxysmal anxiety] without agoraphobia: Secondary | ICD-10-CM | POA: Diagnosis not present

## 2021-01-02 DIAGNOSIS — F4323 Adjustment disorder with mixed anxiety and depressed mood: Secondary | ICD-10-CM | POA: Diagnosis not present

## 2021-01-29 DIAGNOSIS — D2271 Melanocytic nevi of right lower limb, including hip: Secondary | ICD-10-CM | POA: Diagnosis not present

## 2021-01-29 DIAGNOSIS — D485 Neoplasm of uncertain behavior of skin: Secondary | ICD-10-CM | POA: Diagnosis not present

## 2021-01-29 DIAGNOSIS — D2261 Melanocytic nevi of right upper limb, including shoulder: Secondary | ICD-10-CM | POA: Diagnosis not present

## 2021-01-29 DIAGNOSIS — Z85828 Personal history of other malignant neoplasm of skin: Secondary | ICD-10-CM | POA: Diagnosis not present

## 2021-01-29 DIAGNOSIS — C44319 Basal cell carcinoma of skin of other parts of face: Secondary | ICD-10-CM | POA: Diagnosis not present

## 2021-01-29 DIAGNOSIS — D2262 Melanocytic nevi of left upper limb, including shoulder: Secondary | ICD-10-CM | POA: Diagnosis not present

## 2021-06-20 DIAGNOSIS — C44319 Basal cell carcinoma of skin of other parts of face: Secondary | ICD-10-CM | POA: Diagnosis not present

## 2021-06-20 DIAGNOSIS — F4323 Adjustment disorder with mixed anxiety and depressed mood: Secondary | ICD-10-CM | POA: Diagnosis not present

## 2021-06-20 DIAGNOSIS — F410 Panic disorder [episodic paroxysmal anxiety] without agoraphobia: Secondary | ICD-10-CM | POA: Diagnosis not present

## 2021-10-03 DIAGNOSIS — F410 Panic disorder [episodic paroxysmal anxiety] without agoraphobia: Secondary | ICD-10-CM | POA: Diagnosis not present

## 2021-10-03 DIAGNOSIS — F4323 Adjustment disorder with mixed anxiety and depressed mood: Secondary | ICD-10-CM | POA: Diagnosis not present

## 2021-10-30 DIAGNOSIS — F4323 Adjustment disorder with mixed anxiety and depressed mood: Secondary | ICD-10-CM | POA: Diagnosis not present

## 2021-10-30 DIAGNOSIS — F410 Panic disorder [episodic paroxysmal anxiety] without agoraphobia: Secondary | ICD-10-CM | POA: Diagnosis not present

## 2021-12-09 ENCOUNTER — Ambulatory Visit: Payer: BC Managed Care – PPO | Admitting: Family Medicine

## 2022-01-30 ENCOUNTER — Ambulatory Visit (INDEPENDENT_AMBULATORY_CARE_PROVIDER_SITE_OTHER): Payer: BC Managed Care – PPO | Admitting: Family Medicine

## 2022-01-30 ENCOUNTER — Encounter: Payer: Self-pay | Admitting: Family Medicine

## 2022-01-30 VITALS — BP 90/60 | HR 87 | Temp 98.5°F | Ht 69.0 in | Wt 169.4 lb

## 2022-01-30 DIAGNOSIS — J029 Acute pharyngitis, unspecified: Secondary | ICD-10-CM

## 2022-01-30 DIAGNOSIS — J069 Acute upper respiratory infection, unspecified: Secondary | ICD-10-CM | POA: Diagnosis not present

## 2022-01-30 DIAGNOSIS — J020 Streptococcal pharyngitis: Secondary | ICD-10-CM

## 2022-01-30 LAB — POCT RAPID STREP A (OFFICE): Rapid Strep A Screen: POSITIVE — AB

## 2022-01-30 MED ORDER — AMOXICILLIN 875 MG PO TABS: 875.0000 mg | ORAL_TABLET | Freq: Two times a day (BID) | ORAL | 0 refills | Status: AC

## 2022-01-30 MED ORDER — MAGIC MOUTHWASH: ORAL | 0 refills | Status: AC

## 2022-01-30 NOTE — Progress Notes (Signed)
Laura Mclester T. Jaqualyn Juday, MD, [facility] [place], [address]  Phone: [phone]  FAX: [phone]  Laura Fuentes - 42 y.o. female  MRN [id number]  Date of Birth: 18-Dec-1980  Date: 01/30/2022  PCP: Waunita Schooner, MD  Referral: Waunita Schooner, MD  Chief Complaint  Patient presents with   Sore Throat    All started yesterday except for runny nose started Wednesday a week ago Negative Home Covid test this morning   Fever   Nasal Congestion   Generalized Body Aches   Chills   Headache   Diarrhea   Nausea   Subjective:   Laura Fuentes is a 42 y.o. very pleasant female patient with Body mass index is 25.01 kg/m. who presents with the following:  She is a very pleasant young lady, and she presents with a sore throat.  She had some concerns that she could have strep throat.  She started to have some cold-like symptoms last week.  She has had some nasal congestion and cough, but this was relatively mild up until yesterday.  At the onset of symptoms yesterday she started develop some severe sore throat, body aches, fever to over 102.  She feels quite a bit of a severe sore throat, and she has been unable to swallow or minimally able to swallow since then.  She also has had some nausea, vomiting, diarrhea, headache.  She also has had some chills and bodyaches.  The nasal congestion and cough have been present for greater than 1 week.  102 fever Having body aches Felt like she could barely swallow.   + strep And a cold  Review of Systems is noted in the HPI, as appropriate  Objective:   BP 90/60   Pulse 87   Temp 98.5 F (36.9 C) (Oral)   Ht '5\' 9"'$  (1.753 m)   Wt 169 lb 6 oz (76.8 kg)   LMP 01/17/2022   SpO2 97%   BMI 25.01 kg/m    Gen: WDWN, cooperative. Globally Non-toxic HEENT: Normocephalic and atraumatic. Throat clear, w/o exudate, R TM clear, L TM - good landmarks, No fluid present.  rhinnorhea. No frontal or maxillary sinus T. MMM NECK: Anterior cervical  LAD is present and it is tender to palpation CV: RRR, No M/G/R, cap refill <2 sec PULM: Breathing comfortably in no respiratory distress. no wheezing, crackles, rhonchi ABD: S,NT,ND,+BS. No HSM. No rebound. MSK: Nml gait   Laboratory and Imaging Data: Results for orders placed or performed in visit on 01/30/22  POCT rapid strep A  Result Value Ref Range   Rapid Strep A Screen Positive (A) Negative     Assessment and Plan:     ICD-10-CM   1. Strep throat  J02.0     2. Sore throat  J02.9 POCT rapid strep A    3. Viral URI  J06.9      While she seems to have had a preceding URI, the symptoms of this are relatively mild.  Severe sore throat, body aches and fever starting yesterday with a positive strep test.  We will treat this as such with penicillin-based antibiotic.  Medication Management during today's office visit: Meds ordered this encounter  Medications   amoxicillin (AMOXIL) 875 MG tablet    Sig: Take 1 tablet (875 mg total) by mouth 2 (two) times daily.    Dispense:  20 tablet    Refill:  0   magic mouthwash  SOLN    Sig: 80 ml maalox, 80 ml benadryl susp, 80 mL lidocaine 1%  1 tsp po gargle q 4 hours prn sore throat    Dispense:  240 mL    Refill:  0   There are no discontinued medications.  Orders placed today for conditions managed today: Orders Placed This Encounter  Procedures   POCT rapid strep A    Disposition: No follow-ups on file.  [employer] speech-to-text software was used for transcription in this dictation.  Possible transcriptional errors can occur using Editor, commissioning.   Signed,  Maud Deed. Shakala Marlatt, MD   Outpatient Encounter Medications as of 01/30/2022  Medication Sig   ALPRAZolam (XANAX) 1 MG tablet TAKE 1 TABLET BY MOUTH UP TO ONCE DAILY AS NEEDED FOR ANXIETY, NOT TO EXCEED 20 TABLETS PER MONTH   amoxicillin (AMOXIL) 875 MG tablet Take 1 tablet (875 mg  total) by mouth 2 (two) times daily.   CHLOROPHYLL PO Take by mouth daily.   Cholecalciferol (VITAMIN D3 PO) Take by mouth. With K2   Glutamine 500 MG CAPS Take by mouth daily.   magic mouthwash SOLN 80 ml maalox, 80 ml benadryl susp, 80 mL lidocaine 1%  1 tsp po gargle q 4 hours prn sore throat   MAGNESIUM PO Take by mouth daily.   Multiple Vitamin (MULTIVITAMIN PO) Take by mouth daily.   OVER THE COUNTER MEDICATION daily. Gastro-fiber   OVER THE COUNTER MEDICATION daily. Lyme nosode   OVER THE COUNTER MEDICATION daily. Drenatrophin   OVER THE COUNTER MEDICATION daily. CBG and CBD oil   OVER THE COUNTER MEDICATION Biotoxin binder   VITAMIN A PO Take by mouth daily.   No facility-administered encounter medications on file as of 01/30/2022.

## 2022-01-31 ENCOUNTER — Encounter: Payer: Self-pay | Admitting: Family Medicine

## 2022-02-13 DIAGNOSIS — F410 Panic disorder [episodic paroxysmal anxiety] without agoraphobia: Secondary | ICD-10-CM | POA: Diagnosis not present

## 2022-02-13 DIAGNOSIS — F4323 Adjustment disorder with mixed anxiety and depressed mood: Secondary | ICD-10-CM | POA: Diagnosis not present

## 2022-03-20 ENCOUNTER — Other Ambulatory Visit: Payer: Self-pay | Admitting: Otolaryngology

## 2022-03-20 DIAGNOSIS — M26659 Arthropathy of unspecified temporomandibular joint: Secondary | ICD-10-CM | POA: Diagnosis not present

## 2022-03-20 DIAGNOSIS — R600 Localized edema: Secondary | ICD-10-CM

## 2022-03-20 DIAGNOSIS — K1123 Chronic sialoadenitis: Secondary | ICD-10-CM | POA: Diagnosis not present

## 2022-05-15 DIAGNOSIS — F410 Panic disorder [episodic paroxysmal anxiety] without agoraphobia: Secondary | ICD-10-CM | POA: Diagnosis not present

## 2022-05-15 DIAGNOSIS — F4323 Adjustment disorder with mixed anxiety and depressed mood: Secondary | ICD-10-CM | POA: Diagnosis not present

## 2022-06-27 IMAGING — DX DG KNEE COMPLETE 4+V*R*
4 series · 4 of 4 positions shown · non-contrast
Comparison: None.

CLINICAL DATA: Right knee pain and swelling

EXAM:
RIGHT KNEE - COMPLETE 4+ VIEW

[knee ap]
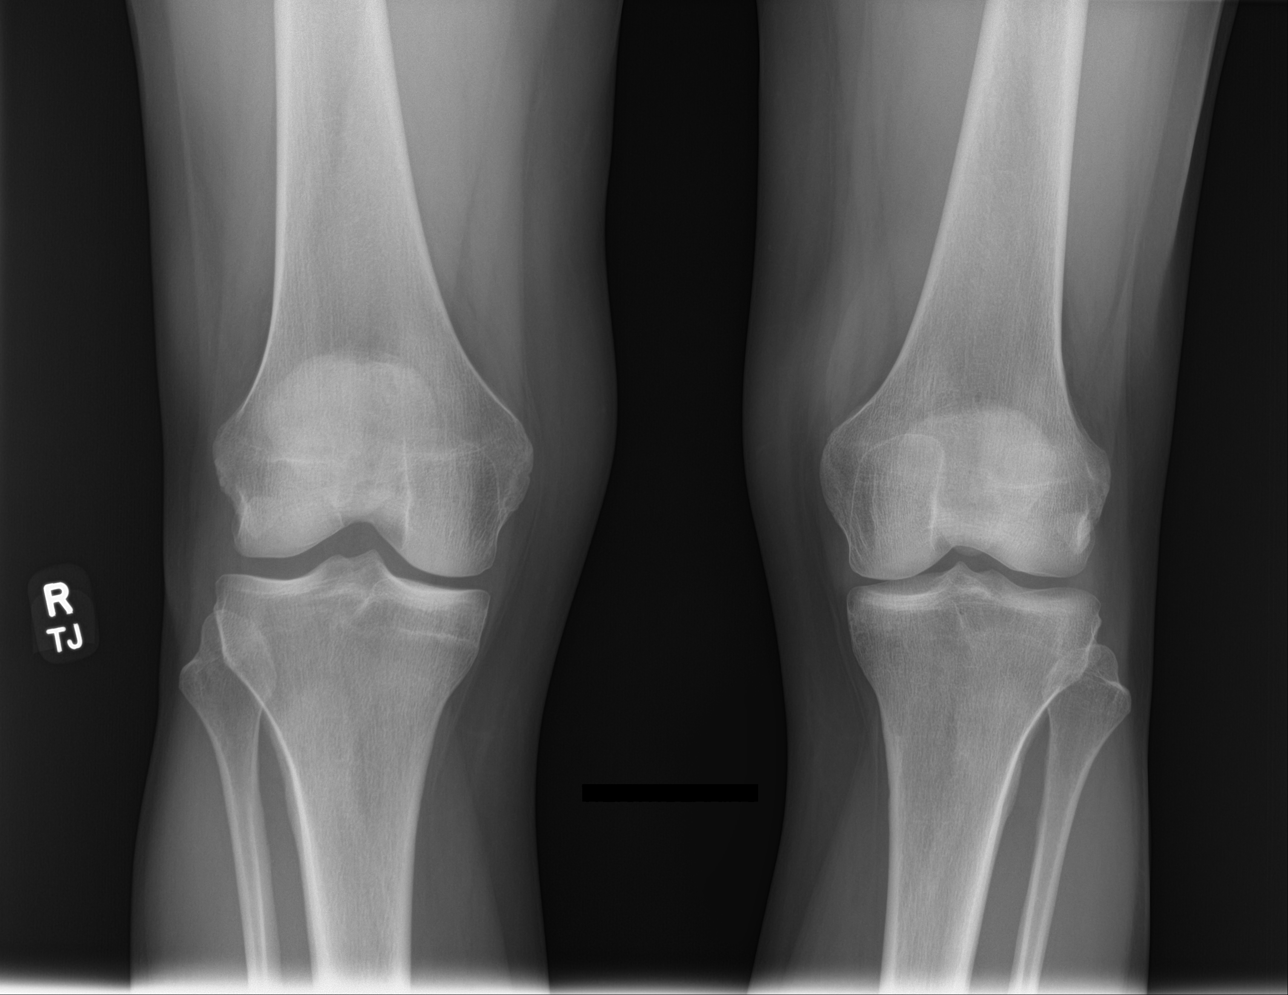

[knee tunnel]
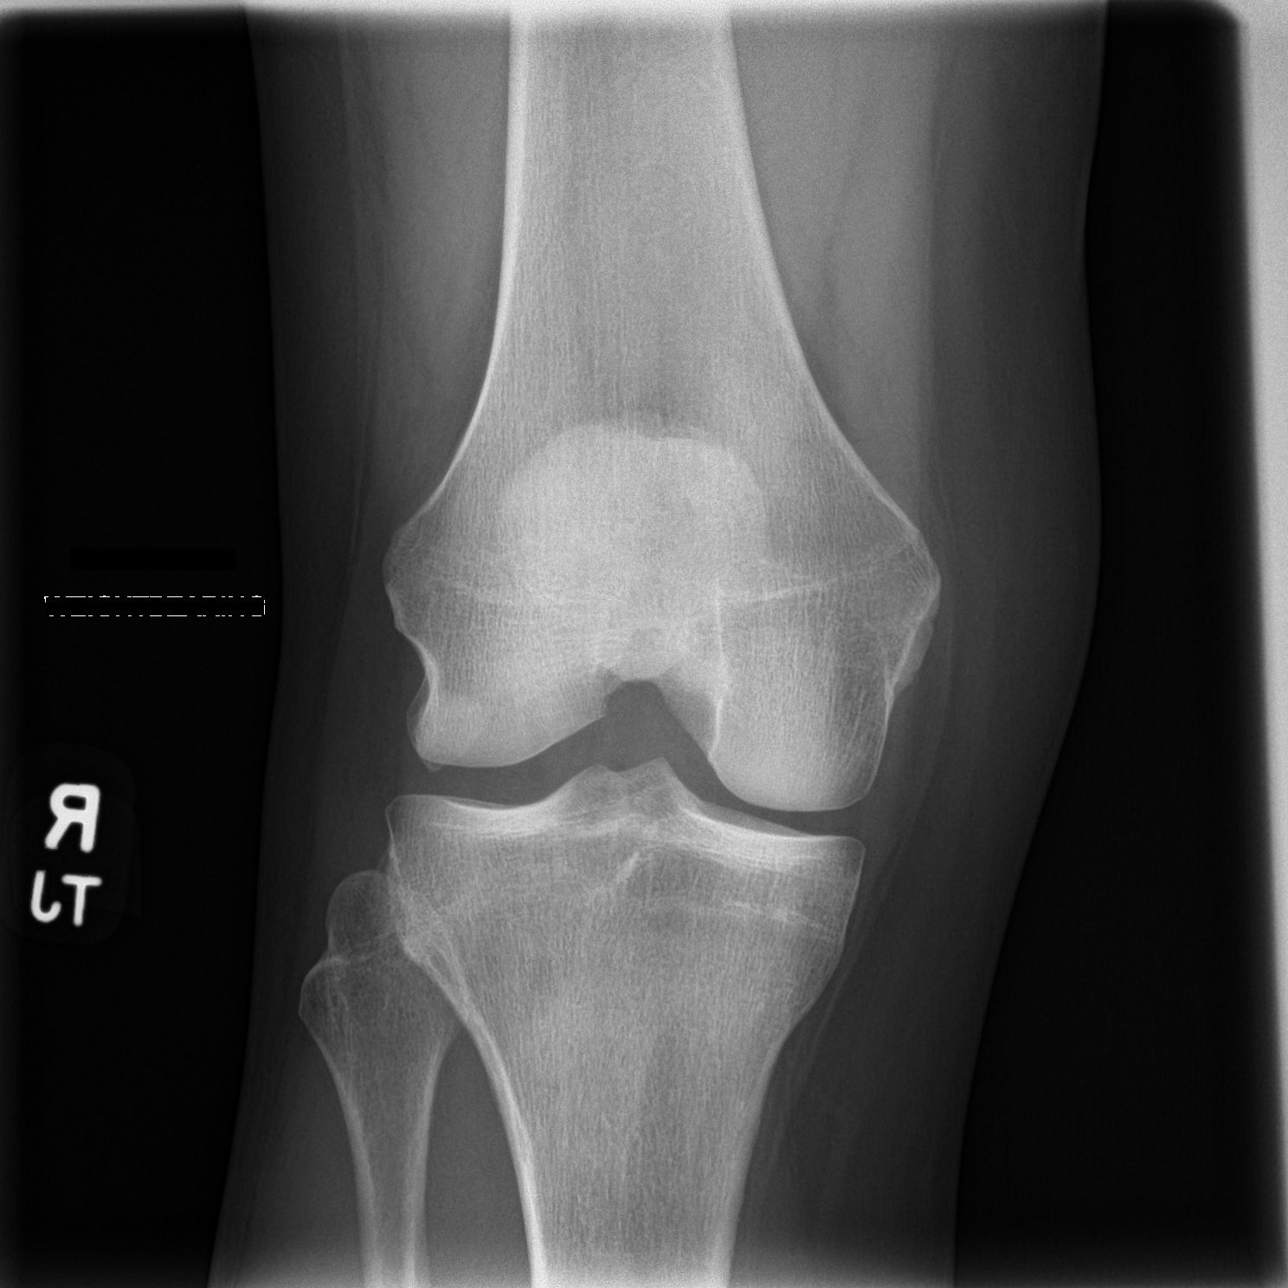

[knee lat]
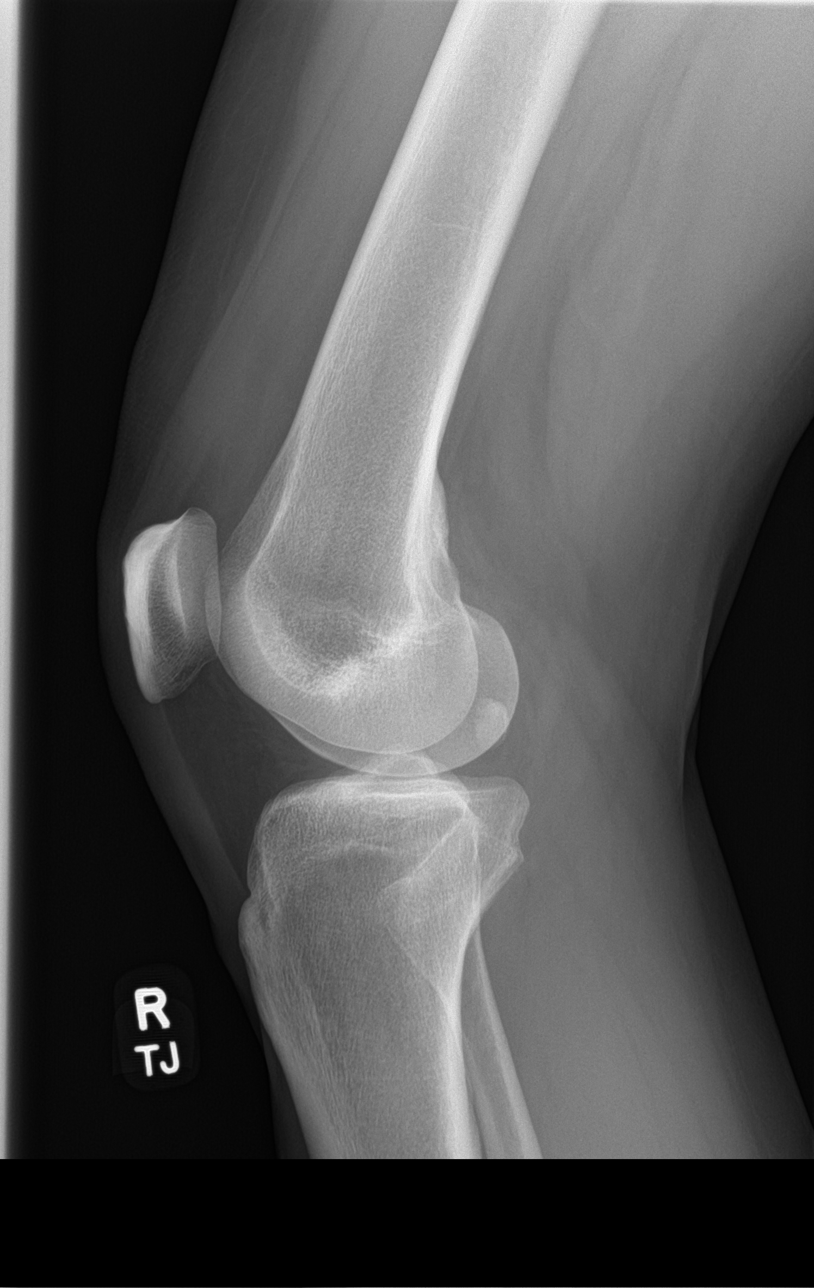

[patella skyline]
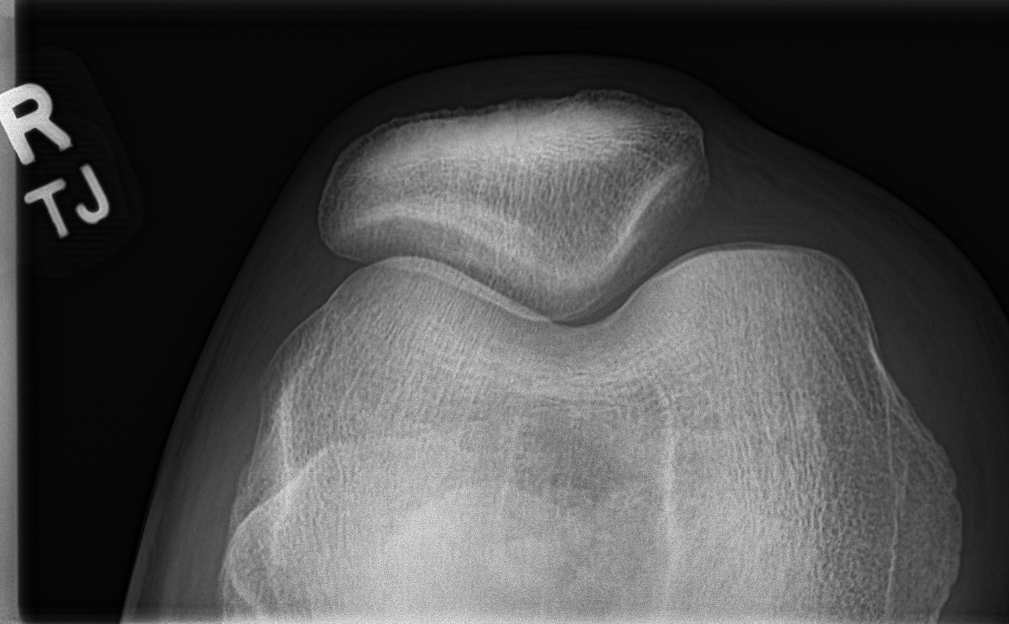

[4 of 4 positions shown; findings below may reference images not displayed]

FINDINGS: Four view radiograph right knee demonstrates normal alignment. No
fracture or dislocation. Joint spaces are preserved. Small right
knee effusion is present. Soft tissues are otherwise unremarkable.
Limited evaluation of the left knee is unremarkable.
IMPRESSION: Small right knee effusion.  No acute fracture or dislocation.

## 2022-07-07 IMAGING — DX DG SI JOINTS 3+V
3 series · 3 of 3 positions shown · non-contrast
Comparison: None.

CLINICAL DATA: SI joint pain

EXAM:
BILATERAL SACROILIAC JOINTS - 3+ VIEW

[si joint (1 of 3)]
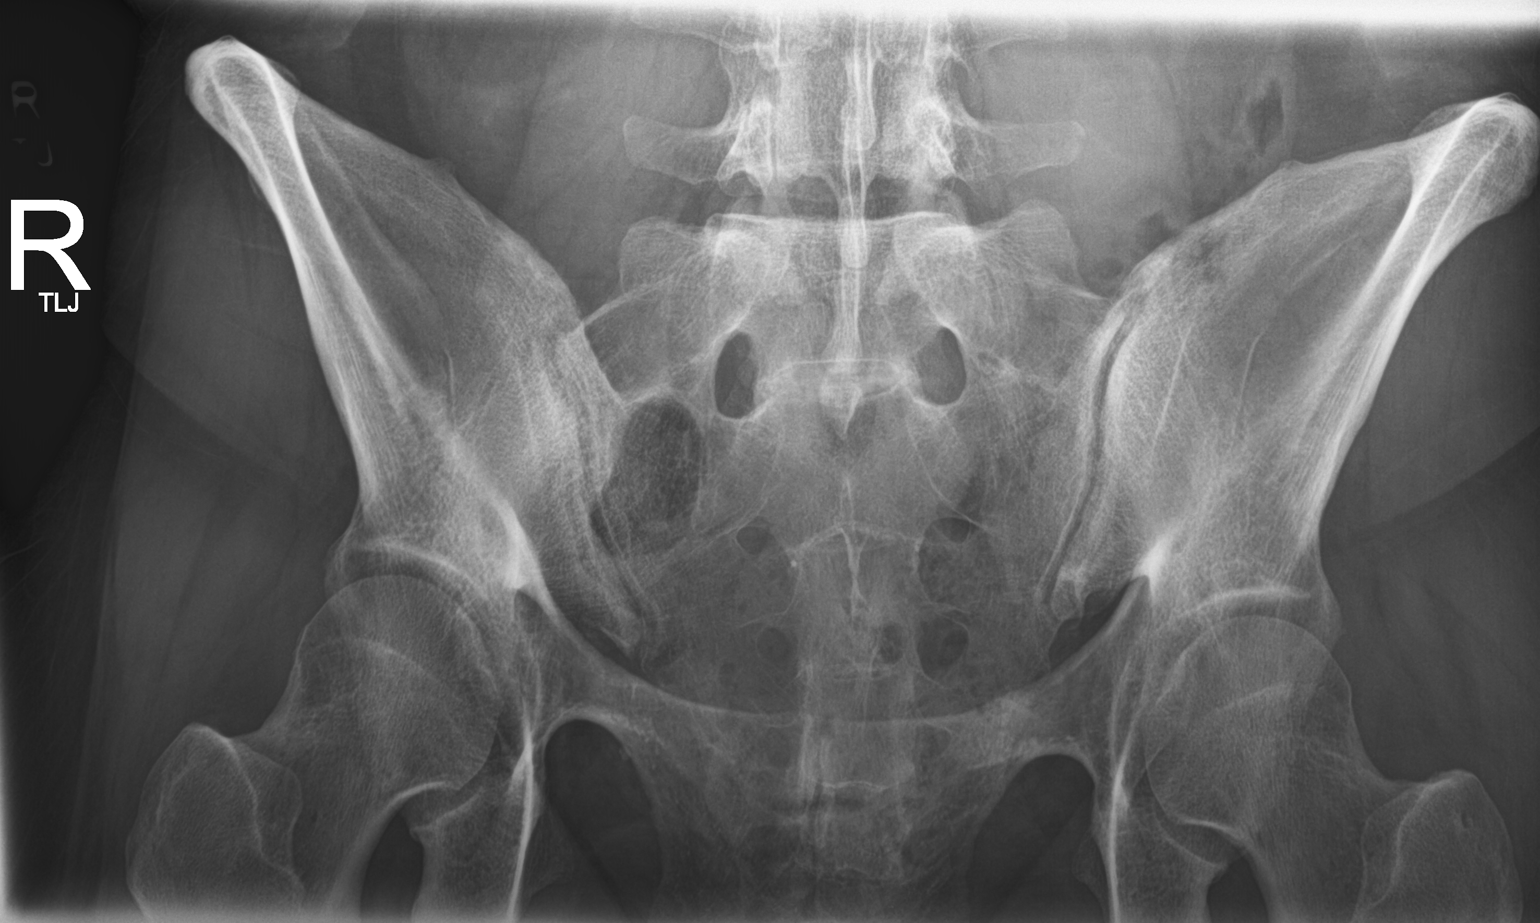

[si joint (2 of 3)]
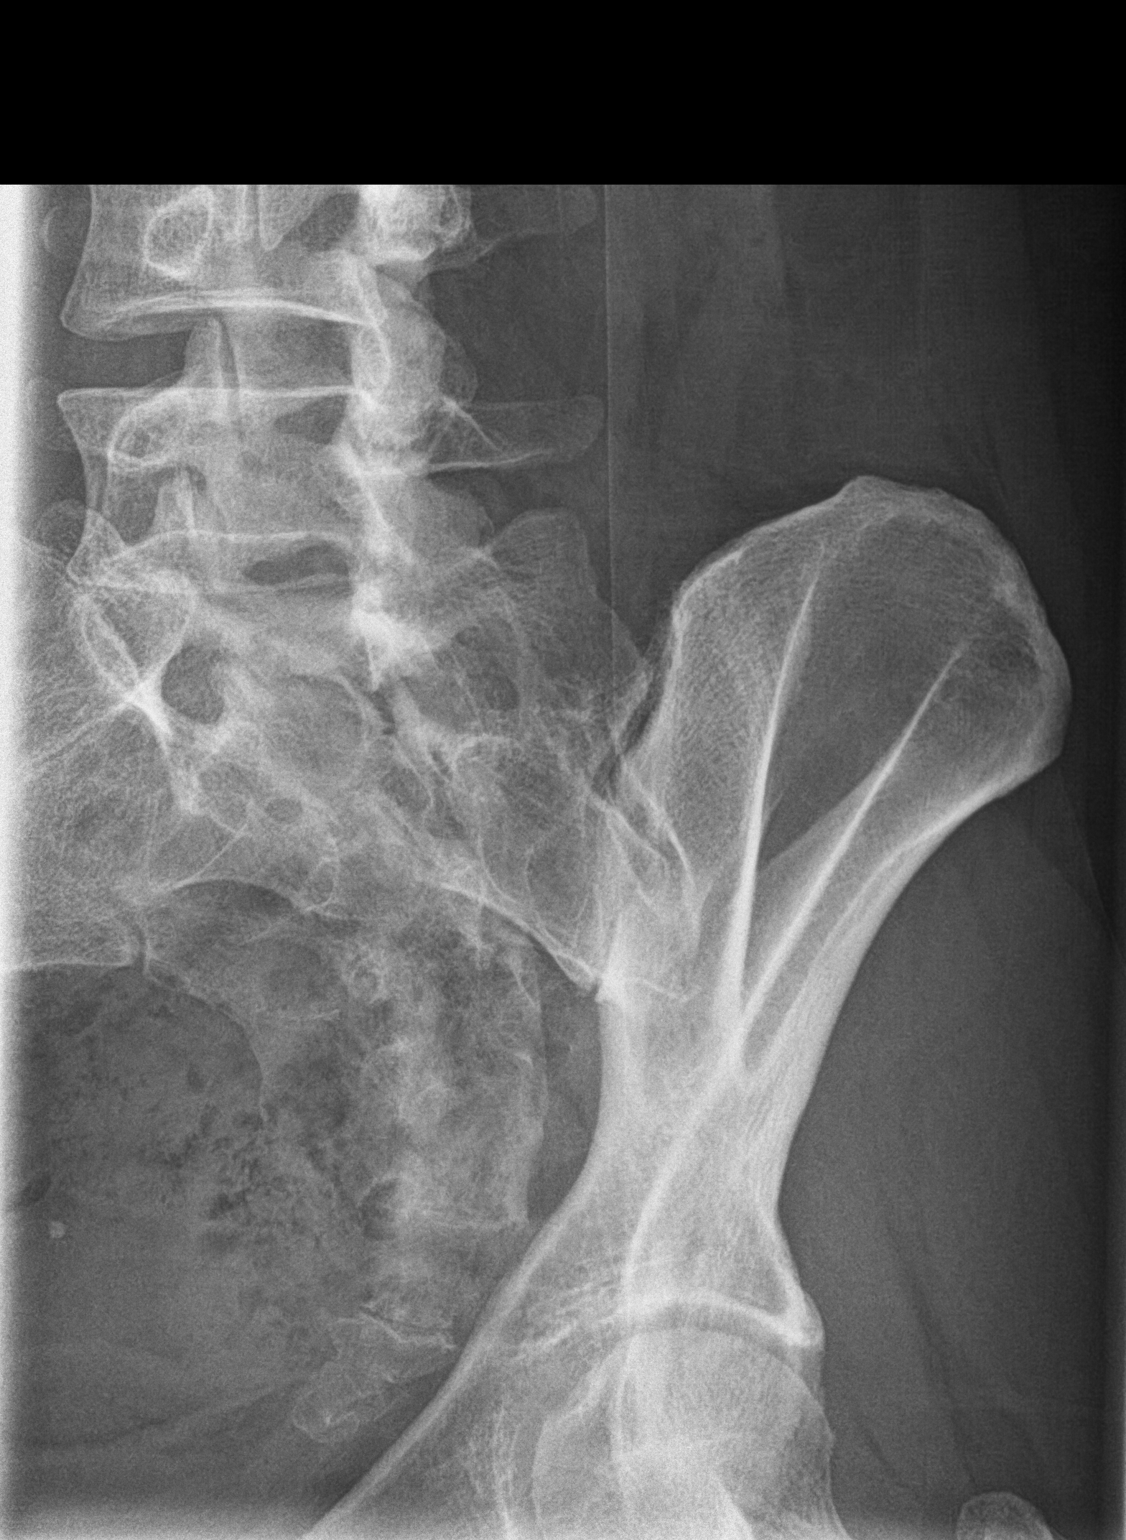

[si joint (3 of 3)]
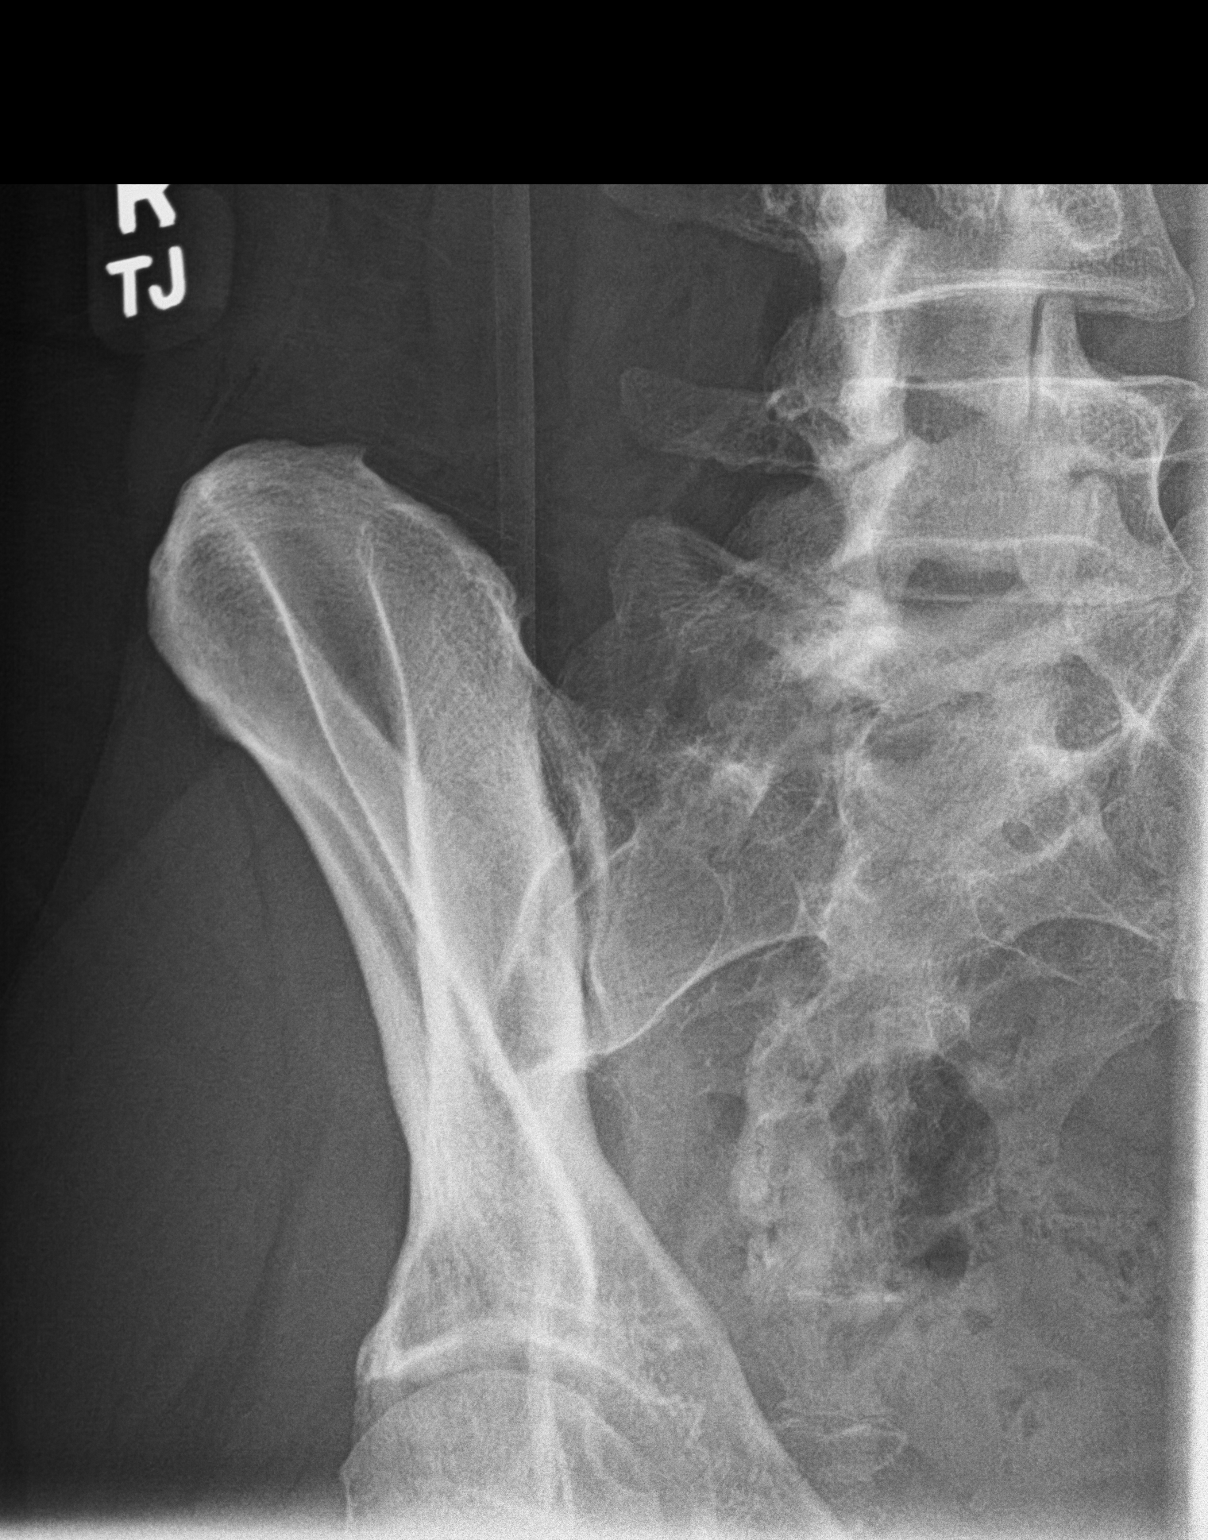

[3 of 3 positions shown; findings below may reference images not displayed]

FINDINGS: The sacroiliac joint spaces are maintained and there is no evidence
of arthropathy. No other bone abnormalities are seen.
IMPRESSION: Negative.

## 2022-08-30 DIAGNOSIS — J209 Acute bronchitis, unspecified: Secondary | ICD-10-CM | POA: Diagnosis not present

## 2022-09-25 DIAGNOSIS — R519 Headache, unspecified: Secondary | ICD-10-CM | POA: Diagnosis not present

## 2022-09-25 DIAGNOSIS — R635 Abnormal weight gain: Secondary | ICD-10-CM | POA: Diagnosis not present

## 2022-09-25 DIAGNOSIS — R5383 Other fatigue: Secondary | ICD-10-CM | POA: Diagnosis not present

## 2022-09-25 DIAGNOSIS — R7982 Elevated C-reactive protein (CRP): Secondary | ICD-10-CM | POA: Diagnosis not present

## 2022-09-25 DIAGNOSIS — K5090 Crohn's disease, unspecified, without complications: Secondary | ICD-10-CM | POA: Diagnosis not present

## 2022-10-05 DIAGNOSIS — R918 Other nonspecific abnormal finding of lung field: Secondary | ICD-10-CM | POA: Diagnosis not present

## 2022-10-05 DIAGNOSIS — R051 Acute cough: Secondary | ICD-10-CM | POA: Diagnosis not present

## 2022-10-05 DIAGNOSIS — J189 Pneumonia, unspecified organism: Secondary | ICD-10-CM | POA: Diagnosis not present

## 2022-10-10 DIAGNOSIS — Z85828 Personal history of other malignant neoplasm of skin: Secondary | ICD-10-CM | POA: Diagnosis not present

## 2022-10-10 DIAGNOSIS — F32A Depression, unspecified: Secondary | ICD-10-CM | POA: Diagnosis not present

## 2022-10-10 DIAGNOSIS — Z79899 Other long term (current) drug therapy: Secondary | ICD-10-CM | POA: Diagnosis not present

## 2022-10-10 DIAGNOSIS — K5090 Crohn's disease, unspecified, without complications: Secondary | ICD-10-CM | POA: Diagnosis not present

## 2022-10-10 DIAGNOSIS — Z0000 Encounter for general adult medical examination without abnormal findings: Secondary | ICD-10-CM | POA: Diagnosis not present

## 2022-10-30 DIAGNOSIS — Z1231 Encounter for screening mammogram for malignant neoplasm of breast: Secondary | ICD-10-CM | POA: Diagnosis not present

## 2022-11-12 DIAGNOSIS — Z719 Counseling, unspecified: Secondary | ICD-10-CM | POA: Diagnosis not present

## 2022-11-12 DIAGNOSIS — Z7189 Other specified counseling: Secondary | ICD-10-CM | POA: Diagnosis not present

## 2022-11-12 DIAGNOSIS — N920 Excessive and frequent menstruation with regular cycle: Secondary | ICD-10-CM | POA: Diagnosis not present

## 2022-11-14 DIAGNOSIS — L718 Other rosacea: Secondary | ICD-10-CM | POA: Diagnosis not present

## 2022-11-14 DIAGNOSIS — C44612 Basal cell carcinoma of skin of right upper limb, including shoulder: Secondary | ICD-10-CM | POA: Diagnosis not present

## 2022-11-14 DIAGNOSIS — D485 Neoplasm of uncertain behavior of skin: Secondary | ICD-10-CM | POA: Diagnosis not present

## 2022-11-14 DIAGNOSIS — L570 Actinic keratosis: Secondary | ICD-10-CM | POA: Diagnosis not present

## 2022-11-14 DIAGNOSIS — B078 Other viral warts: Secondary | ICD-10-CM | POA: Diagnosis not present

## 2022-11-14 DIAGNOSIS — L821 Other seborrheic keratosis: Secondary | ICD-10-CM | POA: Diagnosis not present

## 2022-11-14 DIAGNOSIS — X32XXXA Exposure to sunlight, initial encounter: Secondary | ICD-10-CM | POA: Diagnosis not present

## 2022-11-14 DIAGNOSIS — L720 Epidermal cyst: Secondary | ICD-10-CM | POA: Diagnosis not present

## 2023-01-06 DIAGNOSIS — F419 Anxiety disorder, unspecified: Secondary | ICD-10-CM | POA: Diagnosis not present

## 2023-01-06 DIAGNOSIS — Z719 Counseling, unspecified: Secondary | ICD-10-CM | POA: Diagnosis not present

## 2023-01-06 DIAGNOSIS — Z6826 Body mass index (BMI) 26.0-26.9, adult: Secondary | ICD-10-CM | POA: Diagnosis not present

## 2023-02-04 DIAGNOSIS — C44612 Basal cell carcinoma of skin of right upper limb, including shoulder: Secondary | ICD-10-CM | POA: Diagnosis not present

## 2023-03-04 DIAGNOSIS — J029 Acute pharyngitis, unspecified: Secondary | ICD-10-CM | POA: Diagnosis not present

## 2023-03-04 DIAGNOSIS — R059 Cough, unspecified: Secondary | ICD-10-CM | POA: Diagnosis not present

## 2023-03-04 DIAGNOSIS — Z6826 Body mass index (BMI) 26.0-26.9, adult: Secondary | ICD-10-CM | POA: Diagnosis not present

## 2023-03-04 DIAGNOSIS — J069 Acute upper respiratory infection, unspecified: Secondary | ICD-10-CM | POA: Diagnosis not present

## 2023-03-04 DIAGNOSIS — Z03818 Encounter for observation for suspected exposure to other biological agents ruled out: Secondary | ICD-10-CM | POA: Diagnosis not present

## 2023-03-04 DIAGNOSIS — F419 Anxiety disorder, unspecified: Secondary | ICD-10-CM | POA: Diagnosis not present

## 2023-04-01 DIAGNOSIS — E539 Vitamin B deficiency, unspecified: Secondary | ICD-10-CM | POA: Diagnosis not present

## 2023-04-01 DIAGNOSIS — F331 Major depressive disorder, recurrent, moderate: Secondary | ICD-10-CM | POA: Diagnosis not present

## 2023-04-01 DIAGNOSIS — R5383 Other fatigue: Secondary | ICD-10-CM | POA: Diagnosis not present

## 2023-04-21 DIAGNOSIS — F331 Major depressive disorder, recurrent, moderate: Secondary | ICD-10-CM | POA: Diagnosis not present

## 2023-04-27 DIAGNOSIS — Z7189 Other specified counseling: Secondary | ICD-10-CM | POA: Diagnosis not present

## 2023-04-27 DIAGNOSIS — F419 Anxiety disorder, unspecified: Secondary | ICD-10-CM | POA: Diagnosis not present

## 2023-04-27 DIAGNOSIS — Z6826 Body mass index (BMI) 26.0-26.9, adult: Secondary | ICD-10-CM | POA: Diagnosis not present

## 2023-04-27 DIAGNOSIS — Z719 Counseling, unspecified: Secondary | ICD-10-CM | POA: Diagnosis not present

## 2023-05-05 DIAGNOSIS — F331 Major depressive disorder, recurrent, moderate: Secondary | ICD-10-CM | POA: Diagnosis not present

## 2023-06-03 DIAGNOSIS — F331 Major depressive disorder, recurrent, moderate: Secondary | ICD-10-CM | POA: Diagnosis not present

## 2023-06-16 DIAGNOSIS — F331 Major depressive disorder, recurrent, moderate: Secondary | ICD-10-CM | POA: Diagnosis not present

## 2023-07-14 DIAGNOSIS — Z6826 Body mass index (BMI) 26.0-26.9, adult: Secondary | ICD-10-CM | POA: Diagnosis not present

## 2023-07-14 DIAGNOSIS — R399 Unspecified symptoms and signs involving the genitourinary system: Secondary | ICD-10-CM | POA: Diagnosis not present

## 2023-07-14 DIAGNOSIS — Z0130 Encounter for examination of blood pressure without abnormal findings: Secondary | ICD-10-CM | POA: Diagnosis not present

## 2023-07-14 DIAGNOSIS — Z719 Counseling, unspecified: Secondary | ICD-10-CM | POA: Diagnosis not present

## 2023-08-24 DIAGNOSIS — F419 Anxiety disorder, unspecified: Secondary | ICD-10-CM | POA: Diagnosis not present

## 2023-08-24 DIAGNOSIS — Z6826 Body mass index (BMI) 26.0-26.9, adult: Secondary | ICD-10-CM | POA: Diagnosis not present

## 2023-08-24 DIAGNOSIS — Z719 Counseling, unspecified: Secondary | ICD-10-CM | POA: Diagnosis not present

## 2023-08-24 DIAGNOSIS — F32A Depression, unspecified: Secondary | ICD-10-CM | POA: Diagnosis not present

## 2023-08-24 DIAGNOSIS — R41840 Attention and concentration deficit: Secondary | ICD-10-CM | POA: Diagnosis not present

## 2023-08-27 DIAGNOSIS — F419 Anxiety disorder, unspecified: Secondary | ICD-10-CM | POA: Diagnosis not present

## 2023-08-27 DIAGNOSIS — F32A Depression, unspecified: Secondary | ICD-10-CM | POA: Diagnosis not present

## 2023-08-27 DIAGNOSIS — Z6826 Body mass index (BMI) 26.0-26.9, adult: Secondary | ICD-10-CM | POA: Diagnosis not present

## 2023-08-27 DIAGNOSIS — Z719 Counseling, unspecified: Secondary | ICD-10-CM | POA: Diagnosis not present

## 2023-08-27 DIAGNOSIS — R41840 Attention and concentration deficit: Secondary | ICD-10-CM | POA: Diagnosis not present

## 2023-09-24 DIAGNOSIS — R41840 Attention and concentration deficit: Secondary | ICD-10-CM | POA: Diagnosis not present

## 2023-09-24 DIAGNOSIS — F32A Depression, unspecified: Secondary | ICD-10-CM | POA: Diagnosis not present

## 2023-09-24 DIAGNOSIS — Z79899 Other long term (current) drug therapy: Secondary | ICD-10-CM | POA: Diagnosis not present

## 2023-09-24 DIAGNOSIS — Z6825 Body mass index (BMI) 25.0-25.9, adult: Secondary | ICD-10-CM | POA: Diagnosis not present

## 2023-09-24 DIAGNOSIS — Z0130 Encounter for examination of blood pressure without abnormal findings: Secondary | ICD-10-CM | POA: Diagnosis not present

## 2023-09-24 DIAGNOSIS — F419 Anxiety disorder, unspecified: Secondary | ICD-10-CM | POA: Diagnosis not present

## 2023-09-24 DIAGNOSIS — Z0000 Encounter for general adult medical examination without abnormal findings: Secondary | ICD-10-CM | POA: Diagnosis not present

## 2023-10-04 DIAGNOSIS — J020 Streptococcal pharyngitis: Secondary | ICD-10-CM | POA: Diagnosis not present

## 2023-10-07 DIAGNOSIS — Z0000 Encounter for general adult medical examination without abnormal findings: Secondary | ICD-10-CM | POA: Diagnosis not present

## 2023-10-07 DIAGNOSIS — R5383 Other fatigue: Secondary | ICD-10-CM | POA: Diagnosis not present

## 2023-10-07 DIAGNOSIS — K5090 Crohn's disease, unspecified, without complications: Secondary | ICD-10-CM | POA: Diagnosis not present

## 2023-11-03 DIAGNOSIS — L570 Actinic keratosis: Secondary | ICD-10-CM | POA: Diagnosis not present

## 2023-11-03 DIAGNOSIS — L03012 Cellulitis of left finger: Secondary | ICD-10-CM | POA: Diagnosis not present

## 2023-11-03 DIAGNOSIS — L404 Guttate psoriasis: Secondary | ICD-10-CM | POA: Diagnosis not present

## 2023-11-09 DIAGNOSIS — R10A3 Flank pain, bilateral: Secondary | ICD-10-CM | POA: Diagnosis not present

## 2023-11-09 DIAGNOSIS — L404 Guttate psoriasis: Secondary | ICD-10-CM | POA: Diagnosis not present

## 2023-11-09 DIAGNOSIS — J020 Streptococcal pharyngitis: Secondary | ICD-10-CM | POA: Diagnosis not present

## 2023-11-09 DIAGNOSIS — Z0130 Encounter for examination of blood pressure without abnormal findings: Secondary | ICD-10-CM | POA: Diagnosis not present

## 2023-11-09 DIAGNOSIS — K5090 Crohn's disease, unspecified, without complications: Secondary | ICD-10-CM | POA: Diagnosis not present

## 2023-11-09 DIAGNOSIS — Z6826 Body mass index (BMI) 26.0-26.9, adult: Secondary | ICD-10-CM | POA: Diagnosis not present

## 2023-11-09 DIAGNOSIS — J029 Acute pharyngitis, unspecified: Secondary | ICD-10-CM | POA: Diagnosis not present

## 2023-11-18 DIAGNOSIS — J020 Streptococcal pharyngitis: Secondary | ICD-10-CM | POA: Diagnosis not present

## 2023-11-26 DIAGNOSIS — R41840 Attention and concentration deficit: Secondary | ICD-10-CM | POA: Diagnosis not present

## 2023-11-26 DIAGNOSIS — L404 Guttate psoriasis: Secondary | ICD-10-CM | POA: Diagnosis not present

## 2023-11-26 DIAGNOSIS — F419 Anxiety disorder, unspecified: Secondary | ICD-10-CM | POA: Diagnosis not present

## 2023-11-26 DIAGNOSIS — D8989 Other specified disorders involving the immune mechanism, not elsewhere classified: Secondary | ICD-10-CM | POA: Diagnosis not present

## 2023-12-02 DIAGNOSIS — M546 Pain in thoracic spine: Secondary | ICD-10-CM | POA: Diagnosis not present

## 2023-12-02 DIAGNOSIS — M9902 Segmental and somatic dysfunction of thoracic region: Secondary | ICD-10-CM | POA: Diagnosis not present

## 2023-12-02 DIAGNOSIS — G44201 Tension-type headache, unspecified, intractable: Secondary | ICD-10-CM | POA: Diagnosis not present

## 2023-12-02 DIAGNOSIS — M9901 Segmental and somatic dysfunction of cervical region: Secondary | ICD-10-CM | POA: Diagnosis not present

## 2023-12-09 DIAGNOSIS — M9901 Segmental and somatic dysfunction of cervical region: Secondary | ICD-10-CM | POA: Diagnosis not present

## 2023-12-09 DIAGNOSIS — M9902 Segmental and somatic dysfunction of thoracic region: Secondary | ICD-10-CM | POA: Diagnosis not present

## 2023-12-09 DIAGNOSIS — M5414 Radiculopathy, thoracic region: Secondary | ICD-10-CM | POA: Diagnosis not present

## 2023-12-09 DIAGNOSIS — M5412 Radiculopathy, cervical region: Secondary | ICD-10-CM | POA: Diagnosis not present

## 2024-01-08 DIAGNOSIS — R399 Unspecified symptoms and signs involving the genitourinary system: Secondary | ICD-10-CM | POA: Diagnosis not present

## 2024-01-08 DIAGNOSIS — Z6826 Body mass index (BMI) 26.0-26.9, adult: Secondary | ICD-10-CM | POA: Diagnosis not present

## 2024-01-08 DIAGNOSIS — Z0130 Encounter for examination of blood pressure without abnormal findings: Secondary | ICD-10-CM | POA: Diagnosis not present

## 2024-01-08 DIAGNOSIS — R350 Frequency of micturition: Secondary | ICD-10-CM | POA: Diagnosis not present

## 2024-01-08 DIAGNOSIS — Z719 Counseling, unspecified: Secondary | ICD-10-CM | POA: Diagnosis not present

## 2024-01-08 DIAGNOSIS — Z789 Other specified health status: Secondary | ICD-10-CM | POA: Diagnosis not present
